# Patient Record
Sex: Male | Born: 1995 | Hispanic: Yes | Marital: Single | Smoking: Current every day smoker
Health system: Southern US, Community
[De-identification: ages and names within clinical notes are randomized; demographics above are authoritative.]

## PROBLEM LIST (undated history)

## (undated) ENCOUNTER — Ambulatory Visit: Admission: EM | Source: Home / Self Care

## (undated) DIAGNOSIS — N410 Acute prostatitis: Secondary | ICD-10-CM

## (undated) HISTORY — PX: FRACTURE SURGERY: SHX138

---

## 2016-08-07 DIAGNOSIS — R361 Hematospermia: Secondary | ICD-10-CM | POA: Insufficient documentation

## 2016-08-07 DIAGNOSIS — N489 Disorder of penis, unspecified: Secondary | ICD-10-CM | POA: Insufficient documentation

## 2017-03-31 ENCOUNTER — Emergency Department
Admission: EM | Admit: 2017-03-31 | Discharge: 2017-03-31 | Disposition: A | Discharge status: Home / Self Care | Payer: Self-pay | Attending: Emergency Medicine | Admitting: Emergency Medicine

## 2017-03-31 ENCOUNTER — Encounter: Payer: Self-pay | Admitting: Intensive Care

## 2017-03-31 ENCOUNTER — Other Ambulatory Visit: Payer: Self-pay

## 2017-03-31 DIAGNOSIS — F17210 Nicotine dependence, cigarettes, uncomplicated: Secondary | ICD-10-CM | POA: Insufficient documentation

## 2017-03-31 DIAGNOSIS — L03221 Cellulitis of neck: Secondary | ICD-10-CM | POA: Insufficient documentation

## 2017-03-31 LAB — COMPREHENSIVE METABOLIC PANEL
ALBUMIN: 4.4 g/dL (ref 3.5–5.0)
ALK PHOS: 112 U/L (ref 38–126)
ALT: 35 U/L (ref 17–63)
AST: 38 U/L (ref 15–41)
Anion gap: 8 (ref 5–15)
BILIRUBIN TOTAL: 0.8 mg/dL (ref 0.3–1.2)
BUN: 12 mg/dL (ref 6–20)
CALCIUM: 9.1 mg/dL (ref 8.9–10.3)
CO2: 27 mmol/L (ref 22–32)
CREATININE: 0.71 mg/dL (ref 0.61–1.24)
Chloride: 101 mmol/L (ref 101–111)
GFR calc Af Amer: >60 mL/min (ref >60)
GFR calc non Af Amer: >60 mL/min (ref >60)
GLUCOSE: 97 mg/dL (ref 65–99)
Potassium: 3.4 mmol/L — ABNORMAL LOW (ref 3.5–5.1)
SODIUM: 136 mmol/L (ref 135–145)
Total Protein: 8.2 g/dL — ABNORMAL HIGH (ref 6.5–8.1)

## 2017-03-31 LAB — CBC
HEMATOCRIT: 41.2 % (ref 40.0–52.0)
HEMOGLOBIN: 13.9 g/dL (ref 13.0–18.0)
MCH: 27.7 pg (ref 26.0–34.0)
MCHC: 33.8 g/dL (ref 32.0–36.0)
MCV: 81.9 fL (ref 80.0–100.0)
Platelets: 225 10*3/uL (ref 150–440)
RBC: 5.03 MIL/uL (ref 4.40–5.90)
RDW: 13.6 % (ref 11.5–14.5)
WBC: 10.5 10*3/uL (ref 3.8–10.6)

## 2017-03-31 MED ORDER — CLINDAMYCIN HCL 300 MG PO CAPS: 300.0000 mg | ORAL_CAPSULE | Freq: Four times a day (QID) | ORAL | 0 refills | Status: DC

## 2017-03-31 MED ORDER — CLINDAMYCIN PHOSPHATE 600 MG/50ML IV SOLN
600.0000 mg | Freq: Once | INTRAVENOUS | Status: AC
  Administered 2017-03-31: 600 mg via INTRAVENOUS
  Filled 2017-03-31: qty 50

## 2017-03-31 NOTE — ED Provider Notes (Signed)
[facility] Emergency Department Provider Note  ____________________________________________  Time seen: Approximately 10:58 AM  I have reviewed the triage vital signs and the nursing notes.   HISTORY  Chief Complaint Abscess    HPI Bryan SoursJesse Cordell Macky LowerMuro Montes is a 21 y.o. male that presents to the emergency department for evaluation of sore area to left side of neck that has been worsening for the last week.  Patient states that he gets several abscesses in his neck and these have been reoccurring since he was you.  Usually they drain on their own or his mom tries to drain them.  He has not checked his temperature but has had chills.  He had a leftover prescription of doxycycline from a previous neck abscess and has been taking it for 3 days without improvement. He has not taken any medications for pain today. No drainage, nausea, vomiting.   History reviewed. No pertinent past medical history.  There are no active problems to display for this patient.   History reviewed. No pertinent surgical history.  Prior to Admission medications   Medication Sig Start Date End Date Taking? Authorizing Provider  clindamycin (CLEOCIN) 300 MG capsule Take 1 capsule (300 mg total) by mouth 4 (four) times daily for 10 days. 03/31/17 04/10/17  Enid DerryWagner, Hayzlee Mcsorley, PA-C    Allergies Patient has no known allergies.  History reviewed. No pertinent family history.  Social History Social History   Tobacco Use  . Smoking status: Current Every Day Smoker    Types: Cigarettes  . Smokeless tobacco: Never Used  Substance Use Topics  . Alcohol use: Yes    Comment: occ  . Drug use: No     Review of Systems  Gastrointestinal: No abdominal pain.  No nausea, no vomiting.  Musculoskeletal: Positive for neck pain Skin: Negative for  abrasions, lacerations, ecchymosis. Positive for rash. Neurological: Negative for headaches, numbness or  tingling   ____________________________________________   PHYSICAL EXAM:  VITAL SIGNS: ED Triage Vitals  Enc Vitals Group     BP 03/31/17 1009 108/73     Pulse Rate 03/31/17 1009 66     Resp 03/31/17 1009 14     Temp 03/31/17 1009 98.2 F (36.8 C)     Temp Source 03/31/17 1009 Oral     SpO2 03/31/17 1009 100 %     Weight 03/31/17 1010 140 lb (63.5 kg)     Height 03/31/17 1010 5\' 11"  (1.803 m)     Head Circumference --      Peak Flow --      Pain Score 03/31/17 1009 8     Pain Loc --      Pain Edu? --      Excl. in GC? --      Constitutional: Alert and oriented. Well appearing and in no acute distress. Eyes: Conjunctivae are normal. PERRL. EOMI. Head: Atraumatic. ENT:      Ears:      Nose: No congestion/rhinnorhea.      Mouth/Throat: Mucous membranes are moist.  Neck: No stridor.  Cardiovascular: Normal rate, regular rhythm.  Good peripheral circulation. Respiratory: Normal respiratory effort without tachypnea or retractions. Lungs CTAB. Good air entry to the bases with no decreased or absent breath sounds. Musculoskeletal: Full range of motion to all extremities. No gross deformities appreciated. Neurologic:  Normal speech and language. No gross focal neurologic deficits are appreciated.  Skin:  Skin is warm, dry and intact. 1.5 cm x 1.5 cm area of erythema with 3  cm streak down neck. Tender to palpation. Some anterior induration but no focal areas of swelling or fluctuance. No drainage.   ____________________________________________   LABS (all labs ordered are listed, but only abnormal results are displayed)  Labs Reviewed  COMPREHENSIVE METABOLIC PANEL - Abnormal; Notable for the following components:      Result Value   Potassium 3.4 (*)    Total Protein 8.2 (*)    All other components within normal limits  CULTURE, BLOOD (ROUTINE X 2)  CULTURE, BLOOD (ROUTINE X 2)  CBC    ____________________________________________  EKG   ____________________________________________  RADIOLOGY  No results found.  ____________________________________________    PROCEDURES  Procedure(s) performed:    Procedures    Medications  clindamycin (CLEOCIN) IVPB 600 mg (0 mg Intravenous Stopped 03/31/17 1235)     ____________________________________________   INITIAL IMPRESSION / ASSESSMENT AND PLAN / ED COURSE  Pertinent labs & imaging results that were available during my care of the patient were reviewed by me and considered in my medical decision making (see chart for details).  Review of the [place] CSRS was performed in accordance of the [facility] prior to dispensing any controlled drugs.   Patient's diagnosis is consistent with cellulitis.  Vital signs and exam are reassuring. Patient is afebrile. There is some induration but I am unable to palpate any focal areas of swelling or fluctuance that are able to drain. No increased WBC on CBC. Blood cultures were drawn. IV Clindamycin was given. Patient is concerned about finances and is agreeable to begin Clindamycin and return to the ER tomorrow morning if symptoms worsen for imaging and further evaluation. He will apply warm compresses to neck. Patient will be discharged home with prescriptions for Clindamycin, and he will continue taking the doxycycline that he has already started. Patient is to follow up with ER as directed. Patient is given ED precautions to return to the ED for any worsening or new symptoms.     ____________________________________________  FINAL CLINICAL IMPRESSION(S) / ED DIAGNOSES  Final diagnoses:  Cellulitis of neck      NEW MEDICATIONS STARTED DURING THIS VISIT:  ED Discharge Orders        Ordered    clindamycin (CLEOCIN) 300 MG capsule  4 times daily     03/31/17 1229          This chart was dictated using voice recognition software/Dragon. Despite best efforts to  proofread, errors can occur which can change the meaning. Any change was purely unintentional.    Enid DerryWagner, Aireonna Bauer, PA-C 03/31/17 1552    Minna AntisPaduchowski, Kevin, MD 04/02/17 [phone]

## 2017-03-31 NOTE — ED Notes (Signed)
Reddened raised area left side of neck 3 cm length, 1.5 cm width. Source unknown

## 2017-03-31 NOTE — ED Triage Notes (Signed)
Patient has abscess present on L side of neck. Swelling and redness noted. Patient reports this has been present for weeks

## 2017-04-02 ENCOUNTER — Inpatient Hospital Stay
Admission: EM | Admit: 2017-04-02 | Discharge: 2017-04-05 | DRG: 603 | Disposition: A | Discharge status: Home / Self Care | Payer: Self-pay | Attending: Internal Medicine | Admitting: Internal Medicine

## 2017-04-02 ENCOUNTER — Emergency Department: Payer: Self-pay

## 2017-04-02 ENCOUNTER — Encounter: Payer: Self-pay | Admitting: Emergency Medicine

## 2017-04-02 ENCOUNTER — Other Ambulatory Visit: Payer: Self-pay

## 2017-04-02 DIAGNOSIS — R300 Dysuria: Secondary | ICD-10-CM | POA: Diagnosis present

## 2017-04-02 DIAGNOSIS — L0211 Cutaneous abscess of neck: Principal | ICD-10-CM | POA: Diagnosis present

## 2017-04-02 DIAGNOSIS — R911 Solitary pulmonary nodule: Secondary | ICD-10-CM | POA: Diagnosis present

## 2017-04-02 DIAGNOSIS — L03221 Cellulitis of neck: Secondary | ICD-10-CM | POA: Diagnosis present

## 2017-04-02 DIAGNOSIS — L0291 Cutaneous abscess, unspecified: Secondary | ICD-10-CM

## 2017-04-02 DIAGNOSIS — L720 Epidermal cyst: Secondary | ICD-10-CM | POA: Diagnosis not present

## 2017-04-02 DIAGNOSIS — F17290 Nicotine dependence, other tobacco product, uncomplicated: Secondary | ICD-10-CM | POA: Diagnosis present

## 2017-04-02 HISTORY — DX: Acute prostatitis: N41.0

## 2017-04-02 LAB — COMPREHENSIVE METABOLIC PANEL
ALK PHOS: 99 U/L (ref 38–126)
ALT: 35 U/L (ref 17–63)
ANION GAP: 8 (ref 5–15)
AST: 33 U/L (ref 15–41)
Albumin: 3.9 g/dL (ref 3.5–5.0)
BILIRUBIN TOTAL: 0.8 mg/dL (ref 0.3–1.2)
BUN: 9 mg/dL (ref 6–20)
CALCIUM: 8.9 mg/dL (ref 8.9–10.3)
CO2: 28 mmol/L (ref 22–32)
Chloride: 101 mmol/L (ref 101–111)
Creatinine, Ser: 0.96 mg/dL (ref 0.61–1.24)
GFR calc Af Amer: >60 mL/min (ref >60)
GFR calc non Af Amer: >60 mL/min (ref >60)
Glucose, Bld: 107 mg/dL — ABNORMAL HIGH (ref 65–99)
POTASSIUM: 4.5 mmol/L (ref 3.5–5.1)
Sodium: 137 mmol/L (ref 135–145)
TOTAL PROTEIN: 7.6 g/dL (ref 6.5–8.1)

## 2017-04-02 LAB — CBC WITH DIFFERENTIAL/PLATELET
Basophils Absolute: 0 10*3/uL (ref 0–0.1)
Basophils Relative: 0 %
EOS ABS: 0.2 10*3/uL (ref 0–0.7)
Eosinophils Relative: 2 %
HEMATOCRIT: 37.9 % — AB (ref 40.0–52.0)
HEMOGLOBIN: 13 g/dL (ref 13.0–18.0)
LYMPHS ABS: 1.6 10*3/uL (ref 1.0–3.6)
Lymphocytes Relative: 16 %
MCH: 28 pg (ref 26.0–34.0)
MCHC: 34.2 g/dL (ref 32.0–36.0)
MCV: 81.9 fL (ref 80.0–100.0)
MONO ABS: 0.7 10*3/uL (ref 0.2–1.0)
Monocytes Relative: 7 %
NEUTROS ABS: 7.6 10*3/uL — AB (ref 1.4–6.5)
NEUTROS PCT: 75 %
Platelets: 261 10*3/uL (ref 150–440)
RBC: 4.63 MIL/uL (ref 4.40–5.90)
RDW: 13.4 % (ref 11.5–14.5)
WBC: 10.1 10*3/uL (ref 3.8–10.6)

## 2017-04-02 MED ORDER — OXYCODONE HCL 5 MG PO TABS
5.0000 mg | ORAL_TABLET | ORAL | Status: DC | PRN
  Administered 2017-04-02 – 2017-04-05 (×10): 5 mg via ORAL
  Filled 2017-04-02 (×10): qty 1

## 2017-04-02 MED ORDER — LIDOCAINE HCL (PF) 1 % IJ SOLN
INTRAMUSCULAR | Status: AC
  Filled 2017-04-02: qty 5

## 2017-04-02 MED ORDER — SODIUM CHLORIDE 0.9 % IV SOLN
3.0000 g | Freq: Four times a day (QID) | INTRAVENOUS | Status: DC
  Administered 2017-04-03 – 2017-04-05 (×10): 3 g via INTRAVENOUS
  Filled 2017-04-02 (×16): qty 3

## 2017-04-02 MED ORDER — VANCOMYCIN HCL IN DEXTROSE 1-5 GM/200ML-% IV SOLN
1000.0000 mg | Freq: Once | INTRAVENOUS | Status: AC
  Administered 2017-04-03: 1000 mg via INTRAVENOUS
  Filled 2017-04-02: qty 200

## 2017-04-02 MED ORDER — ACETAMINOPHEN 650 MG RE SUPP: 650.0000 mg | Freq: Four times a day (QID) | RECTAL | Status: DC | PRN

## 2017-04-02 MED ORDER — IOPAMIDOL (ISOVUE-300) INJECTION 61%
75.0000 mL | Freq: Once | INTRAVENOUS | Status: AC | PRN
  Administered 2017-04-02: 75 mL via INTRAVENOUS
  Filled 2017-04-02: qty 75

## 2017-04-02 MED ORDER — ACETAMINOPHEN 325 MG PO TABS
650.0000 mg | ORAL_TABLET | Freq: Four times a day (QID) | ORAL | Status: DC | PRN
  Administered 2017-04-03 – 2017-04-05 (×3): 650 mg via ORAL
  Filled 2017-04-02 (×4): qty 2

## 2017-04-02 MED ORDER — ENOXAPARIN SODIUM 40 MG/0.4ML ~~LOC~~ SOLN
40.0000 mg | SUBCUTANEOUS | Status: DC
  Administered 2017-04-03: 40 mg via SUBCUTANEOUS
  Filled 2017-04-02: qty 0.4

## 2017-04-02 NOTE — H&P (Signed)
[facility] [place] at [facility]   PATIENT NAME: Bryan Montes    MR#:  [id number]  DATE OF BIRTH:  26-Mar-1996  DATE OF ADMISSION:  04/02/2017  PRIMARY CARE PHYSICIAN: [facility]   REQUESTING/REFERRING PHYSICIAN: Fanny Bien, MD  CHIEF COMPLAINT:   Chief Complaint  Patient presents with  . Abscess    HISTORY OF PRESENT ILLNESS:  Bryan Montes  is a 22 y.o. male who presents with longtime history of recurring abscesses.  Patient states that for the past 7 years he has been getting abscesses on his face, neck, as well as back and buttocks.  He states that recently they have not been as bad on his back and buttocks but he keeps getting recurrent episodes of what sound like folliculitis on his neck.  He came in tonight and was seen by ENT who drained the abscesses.  There is no evidence of deep tissue involvement in these abscesses on CT imaging, however ENT did leave a drain in on the left side of his neck.  Hospitalist were called for admission.  Of note, the patient had started on some IV antibiotics for these abscesses and corresponding cellulitis, but these oral antibiotics were not effective.  PAST MEDICAL HISTORY:   Past Medical History:  Diagnosis Date  . Acute prostatitis     PAST SURGICAL HISTORY:   Past Surgical History:  Procedure Laterality Date  . FRACTURE SURGERY      SOCIAL HISTORY:   Social History   Tobacco Use  . Smoking status: Current Every Day Smoker    Types: Cigars  . Smokeless tobacco: Never Used  Substance Use Topics  . Alcohol use: Yes    Comment: occ    FAMILY HISTORY:   Family History  Problem Relation Age of Onset  . Breast cancer Maternal Grandmother     DRUG ALLERGIES:  No Known Allergies  MEDICATIONS AT HOME:   Prior to Admission medications   Medication Sig Start Date End Date Taking? Authorizing Provider  clindamycin (CLEOCIN) 300 MG capsule Take 1 capsule (300 mg total)  by mouth 4 (four) times daily for 10 days. 03/31/17 04/10/17 Yes Enid Derry, PA-C  doxycycline (VIBRAMYCIN) 100 MG capsule Take 1 capsule by mouth 2 (two) times daily. 09/27/16  Yes [provider]  ibuprofen (ADVIL,MOTRIN) 200 MG tablet Take 200-400 mg by mouth every 6 (six) hours as needed.   Yes [provider]  Multiple Vitamin (MULTIVITAMIN WITH MINERALS) TABS tablet Take 1 tablet by mouth daily.   Yes [provider]    REVIEW OF SYSTEMS:  Review of Systems  Constitutional: Negative for chills, fever, malaise/fatigue and weight loss.  HENT: Negative for ear pain, hearing loss and tinnitus.   Eyes: Negative for blurred vision, double vision, pain and redness.  Respiratory: Negative for cough, hemoptysis and shortness of breath.   Cardiovascular: Negative for chest pain, palpitations, orthopnea and leg swelling.  Gastrointestinal: Negative for abdominal pain, constipation, diarrhea, nausea and vomiting.  Genitourinary: Negative for dysuria, frequency and hematuria.  Musculoskeletal: Negative for back pain, joint pain and neck pain.  Skin:       Neck abscesses  Neurological: Negative for dizziness, tremors, focal weakness and weakness.  Endo/Heme/Allergies: Negative for polydipsia. Does not bruise/bleed easily.  Psychiatric/Behavioral: Negative for depression. The patient is not nervous/anxious and does not have insomnia.      VITAL SIGNS:   Vitals:   04/02/17 1651  BP: (!) 112/46  Pulse:  89  Resp: 18  Temp: 98.3 F (36.8 C)  TempSrc: Oral  SpO2: 99%  Weight: 63.5 kg (140 lb)  Height: (!) 11" (0.279 m)   Wt Readings from Last 3 Encounters:  04/02/17 63.5 kg (140 lb)  03/31/17 63.5 kg (140 lb)    PHYSICAL EXAMINATION:  Physical Exam  Vitals reviewed. Constitutional: He is oriented to person, place, and time. He appears well-developed and well-nourished. No distress.  HENT:  Head: Normocephalic and atraumatic.  Mouth/Throat: Oropharynx is  clear and moist.  Eyes: Conjunctivae and EOM are normal. Pupils are equal, round, and reactive to light. No scleral icterus.  Neck: Normal range of motion. Neck supple. No thyromegaly present.  Bilateral abscesses with surrounding erythema which have been drained, the left abscess has drained tube in place  Cardiovascular: Normal rate, regular rhythm and intact distal pulses. Exam reveals no gallop and no friction rub.  No murmur heard. Respiratory: Effort normal and breath sounds normal. No respiratory distress. He has no wheezes. He has no rales.  GI: Soft. Bowel sounds are normal. He exhibits no distension. There is no tenderness.  Musculoskeletal: Normal range of motion. He exhibits no edema.  No arthritis, no gout  Neurological: He is alert and oriented to person, place, and time. No cranial nerve deficit.  No dysarthria, no aphasia  Skin: Skin is warm and dry. No rash noted. There is erythema (bl neck).  Psychiatric: He has a normal mood and affect. His behavior is normal. Judgment and thought content normal.    LABORATORY PANEL:   CBC Recent Labs  Lab 04/02/17 1727  WBC 10.1  HGB 13.0  HCT 37.9*  PLT 261   ------------------------------------------------------------------------------------------------------------------  Chemistries  Recent Labs  Lab 04/02/17 1727  NA 137  K 4.5  CL 101  CO2 28  GLUCOSE 107*  BUN 9  CREATININE 0.96  CALCIUM 8.9  AST 33  ALT 35  ALKPHOS 99  BILITOT 0.8   ------------------------------------------------------------------------------------------------------------------  Cardiac Enzymes No results for input(s): TROPONINI in the last 168 hours. ------------------------------------------------------------------------------------------------------------------  RADIOLOGY:  Ct Soft Tissue Neck W Contrast  Result Date: 04/02/2017 CLINICAL DATA:  Posterior left neck abscess. EXAM: CT NECK WITH CONTRAST TECHNIQUE: Multidetector CT  imaging of the neck was performed using the standard protocol following the bolus administration of intravenous contrast. CONTRAST:  75mL ISOVUE-300 IOPAMIDOL (ISOVUE-300) INJECTION 61% COMPARISON:  None. FINDINGS: Pharynx and larynx: No focal mucosal or submucosal lesions are present. Tonsils are within normal limits. The oropharynx, nasopharynx, and hypopharynx are unremarkable. The epiglottis is normal. Vocal cords are midline and symmetric. Salivary glands: The submandibular and parotid glands are within normal limits bilaterally. Thyroid: Normal Lymph nodes: Bilateral reactive type cervical lymph nodes are present in the level 2, level 3, and level 4 stations, left greater than right. Vascular: Negative. Limited intracranial: Unremarkable. Visualized orbits: Within normal limits. Mastoids and visualized paranasal sinuses: The paranasal sinuses and mastoid air cells are clear. Skeleton: Vertebral body heights and alignment are normal. No focal lytic or blastic lesions are present. No definite disc disease is present. Upper chest: The lung apices are clear.  An azygos fissure is noted. Other: A complex peripherally enhancing fluid collection is present within the subcutaneous tissues of the left posterolateral neck. The area measures 6.5 x 4.4 x 2.2 cm. The collection extends to the deep cervical fascia and is inseparable from the left trapezius muscle. There is no definite extension beyond the deep cervical fascia. There is skin thickening and a smaller  collection in the right posterolateral neck measuring 1.3 x 1.1 x 0.7 cm. IMPRESSION: 1. Complex subcutaneous peripherally enhancing fluid collection compatible with abscess in the left posterolateral neck measuring 6.5 x 4.4 x 2.2 cm extending to the deep cervical fascia without definite invasion into the deeper spaces. 2. The collection is inseparable from the superior aspect of the left trapezius muscle. 3. A smaller subcutaneous collection is also present in  the right posterolateral neck measuring 1.1 cm, also concerning for infection. 4. Bilateral cervical adenopathy is likely reactive. 5. No focal inflammatory change or abscess within the anterior neck. No discrete etiology. This appears to be related to a superficial cellulitis. Electronically Signed   By: Marin Roberts M.D.   On: 04/02/2017 18:15    EKG:  No orders found for this or any previous visit.  IMPRESSION AND PLAN:  Principal Problem:   Neck abscess -broad-spectrum IV antibiotics, wound cultures were sent by ENT, they are following along and will defer to their recommendations for wound management.  They also recommended considering ID consult which has been placed.  All the records are reviewed and case discussed with ED provider. Management plans discussed with the patient and/or family.  DVT PROPHYLAXIS: SubQ lovenox  GI PROPHYLAXIS: None  ADMISSION STATUS: Inpatient  CODE STATUS: Full Code Status History    This patient does not have a recorded code status. Please follow your organizational policy for patients in this situation.     TOTAL TIME TAKING CARE OF THIS PATIENT: 45 minutes.   Shir Bergman FIELDING 04/02/2017, 9:49 PM  [employer]  [phone]  CC: Primary care physician; [facility]  Note:  This document was prepared using Dragon voice recognition software and may include unintentional dictation errors.

## 2017-04-02 NOTE — ED Provider Notes (Signed)
[facility] Emergency Department Provider Note  ____________________________________________   First MD Initiated Contact with Patient 04/02/17 1705     (approximate)  I have reviewed the triage vital signs and the nursing notes.   HISTORY  Chief Complaint Abscess    HPI Caley Volkert Coolidge Gossard is a 22 y.o. male states he was seen on 03/31/17 for an abscessed tooth side of his neck, states he was given IV antibiotics, was discharged with clindamycin and doxycycline, he has been taken all of these medications and the area has become more red and swollen, he denies fever at this time, he denies any trouble breathing, he denies chest pain or shortness of breath  History reviewed. No pertinent past medical history.  There are no active problems to display for this patient.   History reviewed. No pertinent surgical history.  Prior to Admission medications   Medication Sig Start Date End Date Taking? Authorizing Provider  clindamycin (CLEOCIN) 300 MG capsule Take 1 capsule (300 mg total) by mouth 4 (four) times daily for 10 days. 03/31/17 04/10/17  Enid Derry, PA-C    Allergies Patient has no known allergies.  No family history on file.  Social History Social History   Tobacco Use  . Smoking status: Current Every Day Smoker    Types: Cigars  . Smokeless tobacco: Never Used  Substance Use Topics  . Alcohol use: Yes    Comment: occ  . Drug use: No    Review of Systems  Constitutional: No fever/chills Eyes: No visual changes. ENT: No sore throat. Respiratory: Denies cough Genitourinary: Negative for dysuria. Musculoskeletal: Negative for back pain. Skin: Positive for increased redness and swelling at the area on his neck that was marked    ____________________________________________   PHYSICAL EXAM:  VITAL SIGNS: ED Triage Vitals  Enc Vitals Group     BP 04/02/17 1651 (!) 112/46     Pulse Rate 04/02/17 1651 89     Resp  04/02/17 1651 18     Temp 04/02/17 1651 98.3 F (36.8 C)     Temp Source 04/02/17 1651 Oral     SpO2 04/02/17 1651 99 %     Weight 04/02/17 1651 140 lb (63.5 kg)     Height 04/02/17 1651 (!) 11" (0.279 m)     Head Circumference --      Peak Flow --      Pain Score 04/02/17 1650 8     Pain Loc --      Pain Edu? --      Excl. in GC? --     Constitutional: Alert and oriented. Well appearing and in no acute distress. Eyes: Conjunctivae are normal.  Head: Atraumatic. Nose: No congestion/rhinnorhea. Mouth/Throat: Mucous membranes are moist.  Throat is normal Cardiovascular: Normal rate, regular rhythm.  Heart sounds are normal Respiratory: Normal respiratory effort.  No retractions, lungs are clear to auscultation GU: deferred Musculoskeletal: FROM all extremities, warm and well perfused Neurologic:  Normal speech and language.  Skin:  Skin is warm, dry and intact.  The cellulitis at the left side of his neck has moved outside of the area that was marked with black ink, the area is more swollen and tender, neurovascular is intact. Psychiatric: Mood and affect are normal. Speech and behavior are normal.  ____________________________________________   LABS (all labs ordered are listed, but only abnormal results are displayed)  Labs Reviewed  CBC WITH DIFFERENTIAL/PLATELET - Abnormal; Notable for the following components:  Result Value   HCT 37.9 (*)    Neutro Abs 7.6 (*)    All other components within normal limits  COMPREHENSIVE METABOLIC PANEL - Abnormal; Notable for the following components:   Glucose, Bld 107 (*)    All other components within normal limits   ____________________________________________   ____________________________________________  RADIOLOGY  CT of the soft tissues of the neck shows a large collection of fluid typical of an abscess that is deep into the tissues of the  neck.  ____________________________________________   PROCEDURES  Procedure(s) performed: No    ____________________________________________   INITIAL IMPRESSION / ASSESSMENT AND PLAN / ED COURSE  Pertinent labs & imaging results that were available during my care of the patient were reviewed by me and considered in my medical decision making (see chart for details).  Patient is a 22 year old male that was previously seen on 03/31/2017 for an abscess to the left side of the neck, he was given IV clindamycin and was discharged on oral clindamycin and doxycycline, he states the area has continued to enlarge and feels that it is worse, CT of the neck shows a large abscess spreading into the deep tissues, thus the CT and the failed outpatient therapy with Dr. Lamont Snowballifenbark , he recommended that I call ENT, page Dr. Gershon Crane Connell, he states he will come and see the patient and possibly I&D at bedside with admission after that    ----------------------------------------- 10:10 PM on 04/02/2017 -----------------------------------------  Dr. Gershon Crane'Connell was in to I&D the area cultures were sent to the lab, discussed admission with the hospitalist, hospitalist agrees to admit the patient  As part of my medical decision making, I reviewed the following data within the electronic MEDICAL RECORD NUMBERPrevious encounter to the ED ____________________________________________   FINAL CLINICAL IMPRESSION(S) / ED DIAGNOSES  Final diagnoses:  Abscess      NEW MEDICATIONS STARTED DURING THIS VISIT:  This SmartLink is deprecated. Use AVSMEDLIST instead to display the medication list for a patient.   Note:  This document was prepared using Dragon voice recognition software and may include unintentional dictation errors.    Faythe GheeFisher, Susan W, PA-C 04/02/17 2219    [name], Neil, MD 04/03/17 [phone]

## 2017-04-02 NOTE — ED Notes (Signed)
See triage note  Presents for recheck of abscess area to neck  Area is larger and more red

## 2017-04-02 NOTE — Consult Note (Signed)
HPI: I am seeing Bryan Montes in consultation for neck abscesses. 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 This occur bilaterally. They have been drained before. 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 He was recommended ID f/u but was not able to complete due to lack of insurance. This most recent episode has been present for 2 weeks. He has had progression of symptoms despite multiple days of oral antibiotics. There are 3 distinct areas, left posterolateral neck, left SMG region, right posterolateral neck.  On review of care everywhere, prior HIV testing negative.  PMHx: negative except as above Social History: + tobacco, denies elicit drugs, never used IV drugs Family History: there is family history of some superficial infections ROS: 10 point ROS performed, negative except as per HPI  Exam: Gen: NAD,NWOB, voice strong Neck: right neck fluctuance ~1 cm posterolateral Large left neck fluctuance/tenderness/erythema, ~3 cm region Firm left SMG region, no fluctuance Nose: clear anterior rhinoscopy OC/OP: normal mucous membranes, dentition normal, tonsils normal, no lesions/masses Resp: no stridor, voice strong Eyes: EOMI Ear: pinna normal shape, no cutaneous lesions  Imaging: Imaging was personally reviewed. Phlegmon left SMG region. Large multiloculated superficial fluid collection c/w abscess left posterolateral neck. 1 cm fluid collection c/w abscess right posterolateral neck.  Labs: VSS, no leukocytosis  Procedure: informed consent was obtained for drainage of bilateral neck abscesses. Both areas were prepped and draped. They were infiltrated with xylocaine. A 15 blade was used to make a 2 cm incision on the left. Copious pus was expressed. Hemostats were used to break up loculations. This was extremely painful for the patient. A penrose drain was placed. On the right, a 15 blade  was used to make a stab incision, purulence was expressed.  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 I drained both abscesses without complication; as of now there is no fluid to address in the left SMG region. A drain was left in on the left, would continue for 48 hours then remove as long as clinical progress is observed.  We discussed that both location and recurrent nature of his abscesses are atypical. Recommend admission for broad spectrum IV abx and further immunocompromised workup. Consider ID consult. We sent fluid for aerobic/anaerobic culture, mycobacterial culture, and cytology.

## 2017-04-02 NOTE — ED Triage Notes (Signed)
Abscess L posterior neck x 2 weeks.

## 2017-04-02 NOTE — ED Notes (Signed)
Patient transported to room 138. 

## 2017-04-03 LAB — URINALYSIS, ROUTINE W REFLEX MICROSCOPIC
BILIRUBIN URINE: NEGATIVE
Glucose, UA: NEGATIVE mg/dL
HGB URINE DIPSTICK: NEGATIVE
Ketones, ur: NEGATIVE mg/dL
Leukocytes, UA: NEGATIVE
Nitrite: NEGATIVE
Protein, ur: NEGATIVE mg/dL
SPECIFIC GRAVITY, URINE: 1.005 (ref 1.005–1.030)
pH: 6 (ref 5.0–8.0)

## 2017-04-03 LAB — BASIC METABOLIC PANEL
Anion gap: 9 (ref 5–15)
BUN: 8 mg/dL (ref 6–20)
CALCIUM: 8.7 mg/dL — AB (ref 8.9–10.3)
CO2: 27 mmol/L (ref 22–32)
CREATININE: 0.81 mg/dL (ref 0.61–1.24)
Chloride: 100 mmol/L — ABNORMAL LOW (ref 101–111)
GFR calc Af Amer: >60 mL/min (ref >60)
GFR calc non Af Amer: >60 mL/min (ref >60)
GLUCOSE: 99 mg/dL (ref 65–99)
Potassium: 3.5 mmol/L (ref 3.5–5.1)
Sodium: 136 mmol/L (ref 135–145)

## 2017-04-03 LAB — CBC
HCT: 44.9 % (ref 40.0–52.0)
HEMOGLOBIN: 14.8 g/dL (ref 13.0–18.0)
MCH: 26.9 pg (ref 26.0–34.0)
MCHC: 32.9 g/dL (ref 32.0–36.0)
MCV: 81.7 fL (ref 80.0–100.0)
PLATELETS: 294 10*3/uL (ref 150–440)
RBC: 5.49 MIL/uL (ref 4.40–5.90)
RDW: 13.5 % (ref 11.5–14.5)
WBC: 12.7 10*3/uL — ABNORMAL HIGH (ref 3.8–10.6)

## 2017-04-03 MED ORDER — VANCOMYCIN HCL IN DEXTROSE 1-5 GM/200ML-% IV SOLN
1000.0000 mg | Freq: Three times a day (TID) | INTRAVENOUS | Status: DC
  Administered 2017-04-03 – 2017-04-05 (×7): 1000 mg via INTRAVENOUS
  Filled 2017-04-03 (×11): qty 200

## 2017-04-03 MED ORDER — DIPHENHYDRAMINE HCL 25 MG PO CAPS
25.0000 mg | ORAL_CAPSULE | Freq: Four times a day (QID) | ORAL | Status: DC | PRN
  Administered 2017-04-03: 25 mg via ORAL
  Filled 2017-04-03: qty 1

## 2017-04-03 NOTE — Plan of Care (Signed)
  Education: Knowledge of General Education information will improve 04/03/2017 0519 - Progressing by Gregroy Dombkowski, Genelle GatherMatthew Scott, RN

## 2017-04-03 NOTE — Clinical Social Work Note (Signed)
Clinical Social Work Assessment  Patient Details  Name: Bryan Montes MRN: [id number] Date of Birth: February 08, 1996  Date of referral:  04/03/17               Reason for consult:  [employer], Financial Concerns                Permission sought to share information with:  Case Freight forwarder, Tourist information centre manager ) Permission granted to share information::  Yes, Verbal Permission Granted  Name::        Agency::     Relationship::     Contact Information:     Housing/Transportation Living arrangements for the past 2 months:  No permanent address Source of Information:  Patient Patient Interpreter Needed:  None Criminal Activity/Legal Involvement Pertinent to Current Situation/Hospitalization:  No - Comment as needed Significant Relationships:  Parents, Friend Lives with:  Parents Do you feel safe going back to the place where you live?  Yes Need for family participation in patient care:  Yes (Comment)  Care giving concerns:  Patient reported that he does not have a permanent address and stays on his parent's couch (in [place]) and will go stay with friends.    Social Worker assessment / plan:  Holiday representative (CSW) received consult from MD to provide information about medicaid. CSW left a voicemail for the [employer] asking her to assess patient because he has no insurance. CSW met with patient at bedside to provide resources. Patient was sleeping upon CSW entering the room however he was easily woken up. A person was also sleeping in the chair at bedside. Patient reported that he felt comfortable talking in front of that person. CSW introduced self and explained role of CSW department. Patient reported that he has not worked since August 2018 and does not go to school. Patient reported that he does not have a permanent address and stays oh his parent's couch and stays with friends. CSW provided patient with a list of [place]  resources that included transportation, housing and [employer]. CSW also provided patient with a [place] Medicaid application and explained that he would have to complete it and bring it to the DSS office. Patient verbalized his understanding and reported no other needs or concerns. Please reconsult if future social work needs arise. CSW signing off.   Employment status:  Unemployed Forensic scientist:  Self Pay (Medicaid Pending) PT Recommendations:  Not assessed at this time Information / Referral to community resources:  Other (Comment Required)(Medicaid application. )  Patient/Family's Response to care:  Patient appeared motivated to follow up with DSS and complete medicaid application.   Patient/Family's Understanding of and Emotional Response to Diagnosis, Current Treatment, and Prognosis:  Patient reported no other needs and was very pleasant. Patient thanked CSW for visit.   Emotional Assessment Appearance:  Appears stated age Attitude/Demeanor/Rapport:    Affect (typically observed):  Accepting, Adaptable, Pleasant Orientation:  Oriented to Self, Oriented to Place, Oriented to  Time, Oriented to Situation Alcohol / Substance use:  Not Applicable Psych involvement (Current and /or in the community):  No (Comment)  Discharge Needs  Concerns to be addressed:  No discharge needs identified Readmission within the last 30 days:  No Current discharge risk:  None Barriers to Discharge:  Continued Medical Work up   [employer], Veronia Beets, LCSW 04/03/2017, 11:34 AM

## 2017-04-03 NOTE — Consult Note (Signed)
[facility] Infectious Disease     Reason for Consult: Neck abscess, recurrent infections    Referring Physician: Gouru, A Date of Admission:  04/02/2017  Principal Problem:   Neck abscess   HPI: Bryan Montes is a 22 y.o. male admitted with progressive neck pain and swelling and found to have bil neck abscesses.  He has an interesting history of recurrent skin abscesses since the age of 57 but has not been evaluated for these. He states they will often come to a head and drain or will respond to bactrim. He does not recall any prior culture results.  He has had innumerable lesions on his upper back, face, neck, buttock area and LE.  Bryan Montes has some in groin and axilla.  He has not had to have IV abx for these in the ast and denies much work up for them.  He has no other hx of recurrent infections besides repeat strep infections as a child.  He is from [place] but moved to [place] around age 24.  Has been abroad only once to [place] at age 3.  No pets or animal exposures. Has worked in truck washes in past.  Denies any other exposures. Denies fam hx immunodeficiency.  Has had STD tests and neg HIV in Aug 2108.  Past Medical History:  Diagnosis Date  . Acute prostatitis    Past Surgical History:  Procedure Laterality Date  . FRACTURE SURGERY     Social History   Tobacco Use  . Smoking status: Current Every Day Smoker    Types: Cigars  . Smokeless tobacco: Never Used  Substance Use Topics  . Alcohol use: Yes    Comment: occ  . Drug use: No   Family History  Problem Relation Age of Onset  . Breast cancer Maternal Grandmother     Allergies: No Known Allergies  Current antibiotics: Antibiotics Given (last 72 hours)    Date/Time Action Medication Dose Rate   04/03/17 0037 New Bag/Given   vancomycin (VANCOCIN) IVPB 1000 mg/200 mL premix 1,000 mg 200 mL/hr   04/03/17 0037 New Bag/Given   Ampicillin-Sulbactam (UNASYN) 3 g in sodium chloride 0.9 % 100 mL IVPB 3 g 200 mL/hr    04/03/17 0525 New Bag/Given   Ampicillin-Sulbactam (UNASYN) 3 g in sodium chloride 0.9 % 100 mL IVPB 3 g 200 mL/hr   04/03/17 0814 New Bag/Given   vancomycin (VANCOCIN) IVPB 1000 mg/200 mL premix 1,000 mg 200 mL/hr   04/03/17 1206 New Bag/Given  [Med not available from pharmacy at scheculed time resent  x2 per Karen]   Ampicillin-Sulbactam (UNASYN) 3 g in sodium chloride 0.9 % 100 mL IVPB 3 g 200 mL/hr   04/03/17 1510 New Bag/Given   vancomycin (VANCOCIN) IVPB 1000 mg/200 mL premix 1,000 mg 200 mL/hr      MEDICATIONS: . enoxaparin (LOVENOX) injection  40 mg Subcutaneous Q24H    Review of Systems - 11 systems reviewed and negative per HPI   OBJECTIVE: Temp:  [98.3 F (36.8 C)-98.4 F (36.9 C)] 98.3 F (36.8 C) (01/02 0900) Pulse Rate:  [68-89] 72 (01/02 0900) Resp:  [16-18] 18 (01/02 0900) BP: (112-119)/(46-59) 118/57 (01/02 0900) SpO2:  [98 %-99 %] 98 % (01/02 0900) Weight:  [63.5 kg (140 lb)-63.7 kg (140 lb 6.4 oz)] 63.7 kg (140 lb 6.4 oz) (01/01 2347) Physical Exam  Constitutional: He is oriented to person, place, and time. He appears thin No distress.  HENT: anicteric, L neck with Penrose drain in place  from recent I and D with ss drainage Mouth/Throat: Oropharynx is clear and moist. No oropharyngeal exudate.  Cardiovascular: Normal rate, regular rhythm and normal heart sounds. Exam reveals no gallop and no friction rub.  Pulmonary/Chest: Effort normal and breath sounds normal. No respiratory distress. He has no wheezes.  Abdominal: Soft. Bowel sounds are normal. He exhibits no distension. There is no tenderness.  Lymphadenopathy: He has noshoddy  cervical adenopathy.  Neurological: He is alert and oriented to person, place, and time.  Skin: mult healed scars on back, and buttock, scars on face as well. Psychiatric: He has a normal mood and affect. His behavior is normal.    LABS: Results for orders placed or performed during the hospital encounter of 04/02/17 (from  the past 48 hour(s))  CBC with Differential     Status: Abnormal   Collection Time: 04/02/17  5:27 PM  Result Value Ref Range   WBC 10.1 3.8 - 10.6 K/uL   RBC 4.63 4.40 - 5.90 MIL/uL   Hemoglobin 13.0 13.0 - 18.0 g/dL   HCT 37.9 (L) 40.0 - 52.0 %   MCV 81.9 80.0 - 100.0 fL   MCH 28.0 26.0 - 34.0 pg   MCHC 34.2 32.0 - 36.0 g/dL   RDW 13.4 11.5 - 14.5 %   Platelets 261 150 - 440 K/uL   Neutrophils Relative % 75 %   Neutro Abs 7.6 (H) 1.4 - 6.5 K/uL   Lymphocytes Relative 16 %   Lymphs Abs 1.6 1.0 - 3.6 K/uL   Monocytes Relative 7 %   Monocytes Absolute 0.7 0.2 - 1.0 K/uL   Eosinophils Relative 2 %   Eosinophils Absolute 0.2 0 - 0.7 K/uL   Basophils Relative 0 %   Basophils Absolute 0.0 0 - 0.1 K/uL    Comment: Performed at [facility], [address] [place], [place] [address]  Comprehensive metabolic panel     Status: Abnormal   Collection Time: 04/02/17  5:27 PM  Result Value Ref Range   Sodium 137 135 - 145 mmol/L   Potassium 4.5 3.5 - 5.1 mmol/L   Chloride 101 101 - 111 mmol/L   CO2 28 22 - 32 mmol/L   Glucose, Bld 107 (H) 65 - 99 mg/dL   BUN 9 6 - 20 mg/dL   Creatinine, Ser 0.96 0.61 - 1.24 mg/dL   Calcium 8.9 8.9 - 10.3 mg/dL   Total Protein 7.6 6.5 - 8.1 g/dL   Albumin 3.9 3.5 - 5.0 g/dL   AST 33 15 - 41 U/L   ALT 35 17 - 63 U/L   Alkaline Phosphatase 99 38 - 126 U/L   Total Bilirubin 0.8 0.3 - 1.2 mg/dL   GFR calc non Af Amer >60 >60 mL/min   GFR calc Af Amer >60 >60 mL/min    Comment: (NOTE) The eGFR has been calculated using the CKD EPI equation. This calculation has not been validated in all clinical situations. eGFR's persistently <60 mL/min signify possible Chronic Kidney Disease.    Anion gap 8 5 - 15    Comment: Performed at [facility], [address] [place], [place] [address]  Aerobic/Anaerobic Culture (surgical/deep wound)     Status: None (Preliminary result)   Collection Time: 04/02/17  8:56 PM  Result Value Ref Range    Specimen Description      NECK Performed at [facility], [address], [place], [place] [address]    Special Requests  NONE Performed at [facility], [place], [place] [address]    Gram Stain      FEW WBC PRESENT, PREDOMINANTLY PMN RARE GRAM VARIABLE ROD Performed at [facility], [place] [address], [place], [place] [address]    Culture PENDING    Report Status PENDING   Basic metabolic panel     Status: Abnormal   Collection Time: 04/03/17  4:26 AM  Result Value Ref Range   Sodium 136 135 - 145 mmol/L   Potassium 3.5 3.5 - 5.1 mmol/L   Chloride 100 (L) 101 - 111 mmol/L   CO2 27 22 - 32 mmol/L   Glucose, Bld 99 65 - 99 mg/dL   BUN 8 6 - 20 mg/dL   Creatinine, Ser 0.81 0.61 - 1.24 mg/dL   Calcium 8.7 (L) 8.9 - 10.3 mg/dL   GFR calc non Af Amer >60 >60 mL/min   GFR calc Af Amer >60 >60 mL/min    Comment: (NOTE) The eGFR has been calculated using the CKD EPI equation. This calculation has not been validated in all clinical situations. eGFR's persistently <60 mL/min signify possible Chronic Kidney Disease.    Anion gap 9 5 - 15    Comment: Performed at [facility], [address] [place], [place] [address]  CBC     Status: Abnormal   Collection Time: 04/03/17  4:26 AM  Result Value Ref Range   WBC 12.7 (H) 3.8 - 10.6 K/uL   RBC 5.49 4.40 - 5.90 MIL/uL   Hemoglobin 14.8 13.0 - 18.0 g/dL   HCT 44.9 40.0 - 52.0 %   MCV 81.7 80.0 - 100.0 fL   MCH 26.9 26.0 - 34.0 pg   MCHC 32.9 32.0 - 36.0 g/dL   RDW 13.5 11.5 - 14.5 %   Platelets 294 150 - 440 K/uL    Comment: Performed at [facility], [address] [place], [place] [address]  Urinalysis, Routine w reflex microscopic     Status: Abnormal   Collection Time: 04/03/17 12:30 PM  Result Value Ref Range   Color, Urine STRAW (A) YELLOW   APPearance CLEAR (A) CLEAR   Specific Gravity, Urine 1.005 1.005 - 1.030   pH 6.0 5.0 - 8.0   Glucose, UA NEGATIVE  NEGATIVE mg/dL   Hgb urine dipstick NEGATIVE NEGATIVE   Bilirubin Urine NEGATIVE NEGATIVE   Ketones, ur NEGATIVE NEGATIVE mg/dL   Protein, ur NEGATIVE NEGATIVE mg/dL   Nitrite NEGATIVE NEGATIVE   Leukocytes, UA NEGATIVE NEGATIVE    Comment: Performed at [facility], [address] [place], [place] [address]   No components found for: ESR, C REACTIVE PROTEIN MICRO: Recent Results (from the past 720 hour(s))  Blood culture (routine x 2)     Status: None (Preliminary result)   Collection Time: 03/31/17 11:08 AM  Result Value Ref Range Status   Specimen Description BLOOD LEFT ANTECUBITAL  Final   Special Requests   Final    BOTTLES DRAWN AEROBIC AND ANAEROBIC Blood Culture results may not be optimal due to an excessive volume of blood received in culture bottles   Culture   Final    NO GROWTH 3 DAYS Performed at [facility], [address] [place], [place] [address]    Report Status PENDING  Incomplete  Blood culture (routine x 2)     Status: None (Preliminary result)   Collection Time: 03/31/17 11:33 AM  Result Value Ref Range Status  Specimen Description BLOOD RIGHT ANTECUBITAL  Final   Special Requests   Final    BOTTLES DRAWN AEROBIC AND ANAEROBIC Blood Culture adequate volume   Culture   Final    NO GROWTH 3 DAYS Performed at [facility], [address] [place], [place] [address]    Report Status PENDING  Incomplete  Aerobic/Anaerobic Culture (surgical/deep wound)     Status: None (Preliminary result)   Collection Time: 04/02/17  8:56 PM  Result Value Ref Range Status   Specimen Description   Final    NECK Performed at [facility], [address], [place], [place] [address]    Special Requests   Final    NONE Performed at [facility], [address] [place], [place] [address]    Gram Stain   Final    FEW WBC PRESENT, PREDOMINANTLY PMN RARE GRAM VARIABLE ROD Performed at [facility], [place] [address], [place], [place] [address]    Culture PENDING  Incomplete   Report Status PENDING  Incomplete    IMAGING: Ct Soft Tissue Neck W Contrast  Result Date: 04/02/2017 CLINICAL DATA:  Posterior left neck abscess. EXAM: CT NECK WITH CONTRAST TECHNIQUE: Multidetector CT imaging of the neck was performed using the standard protocol following the bolus administration of intravenous contrast. CONTRAST:  32m ISOVUE-300 IOPAMIDOL (ISOVUE-300) INJECTION 61% COMPARISON:  None. FINDINGS: Pharynx and larynx: No focal mucosal or submucosal lesions are present. Tonsils are within normal limits. The oropharynx, nasopharynx, and hypopharynx are unremarkable. The epiglottis is normal. Vocal cords are midline and symmetric. Salivary glands: The submandibular and parotid glands are within normal limits bilaterally. Thyroid: Normal Lymph nodes: Bilateral reactive type cervical lymph nodes are present in the level 2, level 3, and level 4 stations, left greater than right. Vascular: Negative. Limited intracranial: Unremarkable. Visualized orbits: Within normal limits. Mastoids and visualized paranasal sinuses: The paranasal sinuses and mastoid air cells are clear. Skeleton: Vertebral body heights and alignment are normal. No focal lytic or blastic lesions are present. No definite disc disease is present. Upper chest: The lung apices are clear.  An azygos fissure is noted. Other: A complex peripherally enhancing fluid collection is present within the subcutaneous tissues of the left posterolateral neck. The area measures 6.5 x 4.4 x 2.2 cm. The collection extends to the deep cervical fascia and is inseparable from the left trapezius muscle. There is no definite extension beyond the deep cervical fascia. There is skin thickening and a smaller collection in the right posterolateral neck measuring 1.3 x 1.1 x 0.7 cm. IMPRESSION: 1. Complex subcutaneous peripherally enhancing fluid collection compatible with abscess in the left  posterolateral neck measuring 6.5 x 4.4 x 2.2 cm extending to the deep cervical fascia without definite invasion into the deeper spaces. 2. The collection is inseparable from the superior aspect of the left trapezius muscle. 3. A smaller subcutaneous collection is also present in the right posterolateral neck measuring 1.1 cm, also concerning for infection. 4. Bilateral cervical adenopathy is likely reactive. 5. No focal inflammatory change or abscess within the anterior neck. No discrete etiology. This appears to be related to a superficial cellulitis. Electronically Signed   By: CSan MorelleM.D.   On: 04/02/2017 18:15    Assessment:   JHomer MillerMEldrick Penickis a 22y.o. male with a 7 year history of recurrent skin and soft tissue infections since the age of 89  He has had no formal work up for this and  cannot recall having cultures done in past.  He is from [place] but has lived in [place] since age 46. No unusual exposures or pets.  He has no fam hx of immunodeficiency. HIV test is negative. He does not have DM.   On imaging at [facility] he had CT scan with LUL granuloma.   He is not febrile and his wbc only mildly elevated.  Interesting case and his history suggests either an immunodeficiency such as Chronic granulomatous disease, or an unusual presentation of hidradenitis suppurativa.  Infection with an unusual mycobacteria or fungus would be unusual without underlying immune compromise.  Neutrophilic dermatosis would be less likely.  Neutrophil count is normal or elevated so not a neutropenic disorder.   He has had this for 7 years so unlikely to be related to malignancy or lymphoma.   Recommendations Continue unasyn and vanco pending culture Check IG levels, testing for CGD, check complement pathway I have swabbed and sent for AFB  Thank you very much for allowing me to participate in the care of this patient. Please call with questions.   Cheral Marker. Ola Spurr, MD

## 2017-04-03 NOTE — Care Management (Signed)
Met with patient and his girlfriend at bedside. Application given for medication management and [facility]. Email sent to both agencies. Instructions on taking the application to [facility] and his prescriptions prior to 4:30 pm given.  Patient appreciative.

## 2017-04-03 NOTE — Progress Notes (Signed)
[facility] [place] at [facility]   PATIENT NAME: Bryan ModeJesse Muro Montes    MR#:  [id number]  DATE OF BIRTH:  01/18/1996  SUBJECTIVE:  CHIEF COMPLAINT:  Pt is resting comfortably.  Denies any dysphagia.  Pain in the neck is manageable.  Reporting intermittent episodes of pain while urinating  REVIEW OF SYSTEMS:  CONSTITUTIONAL: No fever, fatigue or weakness.  EYES: No blurred or double vision.  EARS, NOSE, AND THROAT: No tinnitus or ear pain.  RESPIRATORY: No cough, shortness of breath, wheezing or hemoptysis.  CARDIOVASCULAR: No chest pain, orthopnea, edema.  GASTROINTESTINAL: No nausea, vomiting, diarrhea or abdominal pain.  GENITOURINARY: No dysuria but reporting dysuria intermittently.  Denies any penile discharge ENDOCRINE: No polyuria, nocturia,  HEMATOLOGY: No anemia, easy bruising or bleeding SKIN: No rash or lesion. MUSCULOSKELETAL: No joint pain or arthritis.   NEUROLOGIC: No tingling, numbness, weakness.  PSYCHIATRY: No anxiety or depression.   DRUG ALLERGIES:  No Known Allergies  VITALS:  Blood pressure (!) 118/57, pulse 72, temperature 98.3 F (36.8 C), temperature source Oral, resp. rate 18, height 5\' 11"  (1.803 m), weight 63.7 kg (140 lb 6.4 oz), SpO2 98 %.  PHYSICAL EXAMINATION:  GENERAL:  22 y.o.-year-old patient lying in the bed with no acute distress.  EYES: Pupils equal, round, reactive to light and accommodation. No scleral icterus. Extraocular muscles intact.  HEENT: Head atraumatic, normocephalic. Oropharynx and nasopharynx clear.  NECK:  Supple, no jugular venous distention. No thyroid enlargement, left lateral aspect of the neck status post drainage of the abscess and the drain is intact  lUNGS: Normal breath sounds bilaterally, no wheezing, rales,rhonchi or crepitation. No use of accessory muscles of respiration.  CARDIOVASCULAR: S1, S2 normal. No murmurs, rubs, or gallops.  ABDOMEN: Soft, nontender, nondistended. Bowel sounds  present. No organomegaly or mass.  EXTREMITIES: No pedal edema, cyanosis, or clubbing.  NEUROLOGIC: Cranial nerves II through XII are intact. Muscle strength 5/5 in all extremities. Sensation intact. Gait not checked.  PSYCHIATRIC: The patient is alert and oriented x 3.  SKIN: No obvious rash, lesion, or ulcer.    LABORATORY PANEL:   CBC Recent Labs  Lab 04/03/17 0426  WBC 12.7*  HGB 14.8  HCT 44.9  PLT 294   ------------------------------------------------------------------------------------------------------------------  Chemistries  Recent Labs  Lab 04/02/17 1727 04/03/17 0426  NA 137 136  K 4.5 3.5  CL 101 100*  CO2 28 27  GLUCOSE 107* 99  BUN 9 8  CREATININE 0.96 0.81  CALCIUM 8.9 8.7*  AST 33  --   ALT 35  --   ALKPHOS 99  --   BILITOT 0.8  --    ------------------------------------------------------------------------------------------------------------------  Cardiac Enzymes No results for input(s): TROPONINI in the last 168 hours. ------------------------------------------------------------------------------------------------------------------  RADIOLOGY:  Ct Soft Tissue Neck W Contrast  Result Date: 04/02/2017 CLINICAL DATA:  Posterior left neck abscess. EXAM: CT NECK WITH CONTRAST TECHNIQUE: Multidetector CT imaging of the neck was performed using the standard protocol following the bolus administration of intravenous contrast. CONTRAST:  75mL ISOVUE-300 IOPAMIDOL (ISOVUE-300) INJECTION 61% COMPARISON:  None. FINDINGS: Pharynx and larynx: No focal mucosal or submucosal lesions are present. Tonsils are within normal limits. The oropharynx, nasopharynx, and hypopharynx are unremarkable. The epiglottis is normal. Vocal cords are midline and symmetric. Salivary glands: The submandibular and parotid glands are within normal limits bilaterally. Thyroid: Normal Lymph nodes: Bilateral reactive type cervical lymph nodes are present in the level 2, level 3, and level 4  stations, left greater  than right. Vascular: Negative. Limited intracranial: Unremarkable. Visualized orbits: Within normal limits. Mastoids and visualized paranasal sinuses: The paranasal sinuses and mastoid air cells are clear. Skeleton: Vertebral body heights and alignment are normal. No focal lytic or blastic lesions are present. No definite disc disease is present. Upper chest: The lung apices are clear.  An azygos fissure is noted. Other: A complex peripherally enhancing fluid collection is present within the subcutaneous tissues of the left posterolateral neck. The area measures 6.5 x 4.4 x 2.2 cm. The collection extends to the deep cervical fascia and is inseparable from the left trapezius muscle. There is no definite extension beyond the deep cervical fascia. There is skin thickening and a smaller collection in the right posterolateral neck measuring 1.3 x 1.1 x 0.7 cm. IMPRESSION: 1. Complex subcutaneous peripherally enhancing fluid collection compatible with abscess in the left posterolateral neck measuring 6.5 x 4.4 x 2.2 cm extending to the deep cervical fascia without definite invasion into the deeper spaces. 2. The collection is inseparable from the superior aspect of the left trapezius muscle. 3. A smaller subcutaneous collection is also present in the right posterolateral neck measuring 1.1 cm, also concerning for infection. 4. Bilateral cervical adenopathy is likely reactive. 5. No focal inflammatory change or abscess within the anterior neck. No discrete etiology. This appears to be related to a superficial cellulitis. Electronically Signed   By: Marin Roberts M.D.   On: 04/02/2017 18:15    EKG:  No orders found for this or any previous visit.  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   All the records are reviewed and case discussed with Care Management/Social Workerr. Management plans discussed with the patient, family and they are in agreement.  CODE STATUS: fc  TOTAL TIME TAKING CARE OF THIS PATIENT: 35 minutes.   POSSIBLE D/C IN 1-2DAYS, DEPENDING ON CLINICAL CONDITION.  Note: This dictation was prepared with Dragon dictation along with smaller phrase technology. Any transcriptional errors that result from this process are unintentional.   Ramonita Lab M.D on 04/03/2017 at 4:21 PM  Between 7am to 6pm - Pager - [phone] After 6pm go to [email or web address] - password [email or web address] [facility]  [place] [facility]  Office  [phone]  CC: Primary care physician; [facility]

## 2017-04-03 NOTE — Progress Notes (Signed)
Pharmacy Antibiotic Note  Bryan SoursJesse Cordell Macky LowerMuro Montes is a 22 y.o. male admitted on 04/02/2017 with neck abscess.  Pharmacy has been consulted for vanc/unasyn dosing.  Plan: Patient received vanc 1g IV x 1  Will f/u w/ vanc 1g IV q8h w/ 6 hour stack dose Will draw vanc trough 1/3 @ 0500 prior to 4th dose. Will start unasyn 3g IV q6h  Ke 0.0946 T1/2 8 hrs Goal trough 15 - 20 mcg/mL  Height: 5\' 11"  (180.3 cm) Weight: 140 lb 6.4 oz (63.7 kg) IBW/kg (Calculated) : 75.3  Temp (24hrs), Avg:98.4 F (36.9 C), Min:98.3 F (36.8 C), Max:98.4 F (36.9 C)  Recent Labs  Lab 03/31/17 1108 04/02/17 1727  WBC 10.5 10.1  CREATININE 0.71 0.96    Estimated Creatinine Clearance: 108.7 mL/min (by C-G formula based on SCr of 0.96 mg/dL).    No Known Allergies   Thank you for allowing pharmacy to be a part of this patient's care.  Thomasene Rippleavid Celenia Hruska, PharmD, BCPS Clinical Pharmacist 04/03/2017

## 2017-04-04 ENCOUNTER — Other Ambulatory Visit: Payer: Self-pay

## 2017-04-04 LAB — BASIC METABOLIC PANEL
Anion gap: 8 (ref 5–15)
BUN: 10 mg/dL (ref 6–20)
CHLORIDE: 103 mmol/L (ref 101–111)
CO2: 27 mmol/L (ref 22–32)
CREATININE: 0.68 mg/dL (ref 0.61–1.24)
Calcium: 8.9 mg/dL (ref 8.9–10.3)
GFR calc Af Amer: >60 mL/min (ref >60)
GFR calc non Af Amer: >60 mL/min (ref >60)
Glucose, Bld: 103 mg/dL — ABNORMAL HIGH (ref 65–99)
Potassium: 4.3 mmol/L (ref 3.5–5.1)
Sodium: 138 mmol/L (ref 135–145)

## 2017-04-04 LAB — SEDIMENTATION RATE: Sed Rate: 35 mm/hr — ABNORMAL HIGH (ref 0–15)

## 2017-04-04 LAB — CBC
HEMATOCRIT: 41.1 % (ref 40.0–52.0)
Hemoglobin: 13.8 g/dL (ref 13.0–18.0)
MCH: 27.5 pg (ref 26.0–34.0)
MCHC: 33.6 g/dL (ref 32.0–36.0)
MCV: 82.1 fL (ref 80.0–100.0)
Platelets: 256 10*3/uL (ref 150–440)
RBC: 5.01 MIL/uL (ref 4.40–5.90)
RDW: 13.4 % (ref 11.5–14.5)
WBC: 7.4 10*3/uL (ref 3.8–10.6)

## 2017-04-04 LAB — CYTOLOGY - NON PAP

## 2017-04-04 LAB — URINE CULTURE
Culture: NO GROWTH
Special Requests: NORMAL

## 2017-04-04 LAB — HIV ANTIBODY (ROUTINE TESTING W REFLEX): HIV SCREEN 4TH GENERATION: NONREACTIVE

## 2017-04-04 LAB — VANCOMYCIN, TROUGH: VANCOMYCIN TR: 15 ug/mL (ref 15–20)

## 2017-04-04 NOTE — Progress Notes (Signed)
[facility] [place] [facility]   PATIENT NAME: Bryan Montes    MR#:  [id number]  DATE OF BIRTH:  08/19/1995  SUBJECTIVE:  CHIEF COMPLAINT:  Pt is resting comfortably.  Reporting another swelling developed next to the current abscess and drained.   patient does not feel comfortable to go home with drain,wanna talk to ENT  REVIEW OF SYSTEMS:  CONSTITUTIONAL: No fever, fatigue or weakness.  EYES: No blurred or double vision.  EARS, NOSE, AND THROAT: No tinnitus or ear pain.  RESPIRATORY: No cough, shortness of breath, wheezing or hemoptysis.  CARDIOVASCULAR: No chest pain, orthopnea, edema.  GASTROINTESTINAL: No nausea, vomiting, diarrhea or abdominal pain.  GENITOURINARY: No dysuria but reporting dysuria intermittently.  Denies any penile discharge ENDOCRINE: No polyuria, nocturia,  HEMATOLOGY: No anemia, easy bruising or bleeding SKIN: No rash or lesion. MUSCULOSKELETAL: No joint pain or arthritis.   NEUROLOGIC: No tingling, numbness, weakness.  PSYCHIATRY: No anxiety or depression.   DRUG ALLERGIES:  No Known Allergies  VITALS:  Blood pressure 111/63, pulse 60, temperature 97.8 F (36.6 C), temperature source Oral, resp. rate 18, height 5\' 11"  (1.803 m), weight 63.7 kg (140 lb 6.4 oz), SpO2 98 %.  PHYSICAL EXAMINATION:  GENERAL:  22 y.o.-year-old patient lying in the bed with no acute distress.  EYES: Pupils equal, round, reactive to light and accommodation. No scleral icterus. Extraocular muscles intact.  HEENT: Head atraumatic, normocephalic. Oropharynx and nasopharynx clear.  NECK:  Supple, no jugular venous distention. No thyroid enlargement, left lateral aspect of the neck status post drainage of the abscess and the drain is intact , 3-4 small non-fluctuating swellings were noticed on the back of the neck lUNGS: Normal breath sounds bilaterally, no wheezing, rales,rhonchi or crepitation. No use of accessory muscles of respiration.   CARDIOVASCULAR: S1, S2 normal. No murmurs, rubs, or gallops.  ABDOMEN: Soft, nontender, nondistended. Bowel sounds present. No organomegaly or mass.  EXTREMITIES: No pedal edema, cyanosis, or clubbing.  NEUROLOGIC: Cranial nerves II through XII are intact. Muscle strength 5/5 in all extremities. Sensation intact. Gait not checked.  PSYCHIATRIC: The patient is alert and oriented x 3.  SKIN: No obvious rash, lesion, or ulcer.    LABORATORY PANEL:   CBC Recent Labs  Lab 04/04/17 0416  WBC 7.4  HGB 13.8  HCT 41.1  PLT 256   ------------------------------------------------------------------------------------------------------------------  Chemistries  Recent Labs  Lab 04/02/17 1727  04/04/17 0416  NA 137   < > 138  K 4.5   < > 4.3  CL 101   < > 103  CO2 28   < > 27  GLUCOSE 107*   < > 103*  BUN 9   < > 10  CREATININE 0.96   < > 0.68  CALCIUM 8.9   < > 8.9  AST 33  --   --   ALT 35  --   --   ALKPHOS 99  --   --   BILITOT 0.8  --   --    < > = values in this interval not displayed.   ------------------------------------------------------------------------------------------------------------------  Cardiac Enzymes No results for input(s): TROPONINI in the last 168 hours. ------------------------------------------------------------------------------------------------------------------  RADIOLOGY:  Ct Soft Tissue Neck W Contrast  Result Date: 04/02/2017 CLINICAL DATA:  Posterior left neck abscess. EXAM: CT NECK WITH CONTRAST TECHNIQUE: Multidetector CT imaging of the neck was performed using the standard protocol following the bolus administration of intravenous contrast. CONTRAST:  75mL ISOVUE-300 IOPAMIDOL (ISOVUE-300) INJECTION  61% COMPARISON:  None. FINDINGS: Pharynx and larynx: No focal mucosal or submucosal lesions are present. Tonsils are within normal limits. The oropharynx, nasopharynx, and hypopharynx are unremarkable. The epiglottis is normal. Vocal cords are  midline and symmetric. Salivary glands: The submandibular and parotid glands are within normal limits bilaterally. Thyroid: Normal Lymph nodes: Bilateral reactive type cervical lymph nodes are present in the level 2, level 3, and level 4 stations, left greater than right. Vascular: Negative. Limited intracranial: Unremarkable. Visualized orbits: Within normal limits. Mastoids and visualized paranasal sinuses: The paranasal sinuses and mastoid air cells are clear. Skeleton: Vertebral body heights and alignment are normal. No focal lytic or blastic lesions are present. No definite disc disease is present. Upper chest: The lung apices are clear.  An azygos fissure is noted. Other: A complex peripherally enhancing fluid collection is present within the subcutaneous tissues of the left posterolateral neck. The area measures 6.5 x 4.4 x 2.2 cm. The collection extends to the deep cervical fascia and is inseparable from the left trapezius muscle. There is no definite extension beyond the deep cervical fascia. There is skin thickening and a smaller collection in the right posterolateral neck measuring 1.3 x 1.1 x 0.7 cm. IMPRESSION: 1. Complex subcutaneous peripherally enhancing fluid collection compatible with abscess in the left posterolateral neck measuring 6.5 x 4.4 x 2.2 cm extending to the deep cervical fascia without definite invasion into the deeper spaces. 2. The collection is inseparable from the superior aspect of the left trapezius muscle. 3. A smaller subcutaneous collection is also present in the right posterolateral neck measuring 1.1 cm, also concerning for infection. 4. Bilateral cervical adenopathy is likely reactive. 5. No focal inflammatory change or abscess within the anterior neck. No discrete etiology. This appears to be related to a superficial cellulitis. Electronically Signed   By: Marin Roberts M.D.   On: 04/02/2017 18:15    EKG:  No orders found for this or any previous  visit.  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  HIV ruled out  #Dysuria  Urinalysis normal urine culture is pending    #Leukocytosis secondary to neck abscess and status post drainage Improving.  WBC is normal at 7.4 today   All the records are reviewed and case discussed with Care Management/Social Workerr. Management plans discussed with the patient, family and they are in agreement.  CODE STATUS: fc  TOTAL TIME TAKING CARE OF THIS PATIENT: 30 minutes.   POSSIBLE D/C IN 1-2DAYS, DEPENDING ON CLINICAL CONDITION.  Note: This dictation was prepared with Dragon dictation along with smaller phrase technology. Any transcriptional errors that result from this process are unintentional.   Ramonita Lab M.D on 04/04/2017 at 2:23 PM  Between 7am to 6pm - Pager - [phone] After 6pm go to [email or web address] - password [email or web address] [facility]  [place] [facility]  Office  [phone]  CC: Primary care physician; [facility]

## 2017-04-04 NOTE — Progress Notes (Signed)
[facility] INFECTIOUS DISEASE PROGRESS NOTE Date of Admission:  04/02/2017     ID: Bryan Montes is a 22 y.o. male with neck abscess Principal Problem:   Neck abscess  Subjective: No fevers, still some neck swelling  ROS  Eleven systems are reviewed and negative except per hpi  Medications:  Antibiotics Given (last 72 hours)    Date/Time Action Medication Dose Rate   04/03/17 0037 New Bag/Given   vancomycin (VANCOCIN) IVPB 1000 mg/200 mL premix 1,000 mg 200 mL/hr   04/03/17 0037 New Bag/Given   Ampicillin-Sulbactam (UNASYN) 3 g in sodium chloride 0.9 % 100 mL IVPB 3 g 200 mL/hr   04/03/17 0525 New Bag/Given   Ampicillin-Sulbactam (UNASYN) 3 g in sodium chloride 0.9 % 100 mL IVPB 3 g 200 mL/hr   04/03/17 0814 New Bag/Given   vancomycin (VANCOCIN) IVPB 1000 mg/200 mL premix 1,000 mg 200 mL/hr   04/03/17 1206 New Bag/Given  [Med not available from pharmacy at scheculed time resent  x2 per Karen]   Ampicillin-Sulbactam (UNASYN) 3 g in sodium chloride 0.9 % 100 mL IVPB 3 g 200 mL/hr   04/03/17 1510 New Bag/Given   vancomycin (VANCOCIN) IVPB 1000 mg/200 mL premix 1,000 mg 200 mL/hr   04/03/17 1739 New Bag/Given   Ampicillin-Sulbactam (UNASYN) 3 g in sodium chloride 0.9 % 100 mL IVPB 3 g 200 mL/hr   04/03/17 2029 New Bag/Given   vancomycin (VANCOCIN) IVPB 1000 mg/200 mL premix 1,000 mg 200 mL/hr   04/03/17 2301 New Bag/Given   Ampicillin-Sulbactam (UNASYN) 3 g in sodium chloride 0.9 % 100 mL IVPB 3 g 200 mL/hr   04/04/17 0432 New Bag/Given   Ampicillin-Sulbactam (UNASYN) 3 g in sodium chloride 0.9 % 100 mL IVPB 3 g 200 mL/hr   04/04/17 0537 New Bag/Given   vancomycin (VANCOCIN) IVPB 1000 mg/200 mL premix 1,000 mg 100 mL/hr   04/04/17 1136 New Bag/Given   Ampicillin-Sulbactam (UNASYN) 3 g in sodium chloride 0.9 % 100 mL IVPB 3 g 200 mL/hr     . enoxaparin (LOVENOX) injection  40 mg Subcutaneous Q24H    Objective: Vital signs in last 24 hours: Temp:  [97.6 F  (36.4 C)-98.8 F (37.1 C)] 97.8 F (36.6 C) (01/03 0715) Pulse Rate:  [56-65] 60 (01/03 0715) Resp:  [18-19] 18 (01/03 0715) BP: (108-117)/(58-63) 111/63 (01/03 0715) SpO2:  [98 %-100 %] 98 % (01/03 0715) Constitutional: He is oriented to person, place, and time. He appears thin No distress.  HENT: anicteric, L neck with Penrose drain in place from recent I and D with ss drainage Mouth/Throat: Oropharynx is clear and moist. No oropharyngeal exudate.  Cardiovascular: Normal rate, regular rhythm and normal heart sounds. Exam reveals no gallop and no friction rub.  Pulmonary/Chest: Effort normal and breath sounds normal. No respiratory distress. He has no wheezes.  Abdominal: Soft. Bowel sounds are normal. He exhibits no distension. There is no tenderness.  Lymphadenopathy: He has noshoddy  cervical adenopathy.  Neurological: He is alert and oriented to person, place, and time.  Skin: mult healed scars on back, and buttock, scars on face as well. Psychiatric: He has a normal mood and affect. His behavior is normal.     Lab Results Recent Labs    04/03/17 0426 04/04/17 0416  WBC 12.7* 7.4  HGB 14.8 13.8  HCT 44.9 41.1  NA 136 138  K 3.5 4.3  CL 100* 103  CO2 27 27  BUN 8 10  CREATININE 0.81 0.68  Microbiology: Results for orders placed or performed during the hospital encounter of 04/02/17  Aerobic/Anaerobic Culture (surgical/deep wound)     Status: None (Preliminary result)   Collection Time: 04/02/17  8:56 PM  Result Value Ref Range Status   Specimen Description   Final    NECK Performed at [facility], [address] [place], [place] [address]    Special Requests   Final    NONE Performed at [facility], [address] [place], [place] [address]    Gram Stain   Final    FEW WBC PRESENT, PREDOMINANTLY PMN RARE GRAM VARIABLE ROD    Culture   Final    NO GROWTH 1 DAY Performed at [facility], [address], [place], [place]  [address]    Report Status PENDING  Incomplete    Studies/Results: Ct Soft Tissue Neck W Contrast  Result Date: 04/02/2017 CLINICAL DATA:  Posterior left neck abscess. EXAM: CT NECK WITH CONTRAST TECHNIQUE: Multidetector CT imaging of the neck was performed using the standard protocol following the bolus administration of intravenous contrast. CONTRAST:  75mL ISOVUE-300 IOPAMIDOL (ISOVUE-300) INJECTION 61% COMPARISON:  None. FINDINGS: Pharynx and larynx: No focal mucosal or submucosal lesions are present. Tonsils are within normal limits. The oropharynx, nasopharynx, and hypopharynx are unremarkable. The epiglottis is normal. Vocal cords are midline and symmetric. Salivary glands: The submandibular and parotid glands are within normal limits bilaterally. Thyroid: Normal Lymph nodes: Bilateral reactive type cervical lymph nodes are present in the level 2, level 3, and level 4 stations, left greater than right. Vascular: Negative. Limited intracranial: Unremarkable. Visualized orbits: Within normal limits. Mastoids and visualized paranasal sinuses: The paranasal sinuses and mastoid air cells are clear. Skeleton: Vertebral body heights and alignment are normal. No focal lytic or blastic lesions are present. No definite disc disease is present. Upper chest: The lung apices are clear.  An azygos fissure is noted. Other: A complex peripherally enhancing fluid collection is present within the subcutaneous tissues of the left posterolateral neck. The area measures 6.5 x 4.4 x 2.2 cm. The collection extends to the deep cervical fascia and is inseparable from the left trapezius muscle. There is no definite extension beyond the deep cervical fascia. There is skin thickening and a smaller collection in the right posterolateral neck measuring 1.3 x 1.1 x 0.7 cm. IMPRESSION: 1. Complex subcutaneous peripherally enhancing fluid collection compatible with abscess in the left posterolateral neck measuring 6.5 x 4.4 x 2.2 cm  extending to the deep cervical fascia without definite invasion into the deeper spaces. 2. The collection is inseparable from the superior aspect of the left trapezius muscle. 3. A smaller subcutaneous collection is also present in the right posterolateral neck measuring 1.1 cm, also concerning for infection. 4. Bilateral cervical adenopathy is likely reactive. 5. No focal inflammatory change or abscess within the anterior neck. No discrete etiology. This appears to be related to a superficial cellulitis. Electronically Signed   By: Marin Roberts M.D.   On: 04/02/2017 18:15    Assessment/Plan: Bryan Montes is a 22 y.o. male with a 7 year history of recurrent skin and soft tissue infections since the age of 63.  He has had no formal work up for this and cannot recall having cultures done in past.  He is from [place] but has lived in [place] since age 41. No unusual exposures or pets.  He has no fam hx of immunodeficiency. HIV test is negative. He does not  have DM.   On imaging at [facility] he had CT scan with LUL granuloma.   He is not febrile and his wbc only mildly elevated.  Interesting case and his history suggests either an immunodeficiency such as Chronic granulomatous disease, or an unusual presentation of hidradenitis suppurativa.  Infection with an unusual mycobacteria or fungus would be unusual without underlying immune compromise.  Neutrophilic dermatosis would be less likely.  Neutrophil count is normal or elevated so not a neutropenic disorder.  He has had this for 7 years so unlikely to be related to malignancy or lymphoma.   Recommendations Continue unasyn and vanco pending culture Pending IG levels, testing for CGD, check complement pathway I have swabbed and sent for AFB Can dc home on augmetin if stable tomorrow.    Thank you very much for the consult. Will follow with you.  Mick Sell   04/04/2017, 2:36 PM

## 2017-04-04 NOTE — Progress Notes (Signed)
Pharmacy Antibiotic Note  Bryan Montes is a 22 y.o. male admitted on 04/02/2017 with neck abscess.  Pharmacy has been consulted for vancomycin and ampicillin/sulbactam dosing.  ID is following.  Plan: VT = 15 mcg/mL is therapeutic. Continue vancomycin 1000 mg IV q8h Goal VT 15-20 mcg/mL  Continue ampicillin/sulbactam 3 g IV q6h  Height: 5\' 11"  (180.3 cm) Weight: 140 lb 6.4 oz (63.7 kg) IBW/kg (Calculated) : 75.3  Temp (24hrs), Avg:98.1 F (36.7 C), Min:97.6 F (36.4 C), Max:98.8 F (37.1 C)  Recent Labs  Lab 03/31/17 1108 04/02/17 1727 04/03/17 0426 04/04/17 0416 04/04/17 1325  WBC 10.5 10.1 12.7* 7.4  --   CREATININE 0.71 0.96 0.81 0.68  --   VANCOTROUGH  --   --   --   --  15    Estimated Creatinine Clearance: 130.5 mL/min (by C-G formula based on SCr of 0.68 mg/dL).    No Known Allergies   Thank you for allowing pharmacy to be a part of this patient's care.  Cindi CarbonMary M Sedric Guia, PharmD, BCPS Clinical Pharmacist 04/04/2017

## 2017-04-04 NOTE — Consult Note (Signed)
..   04/04/2017 5:41 PM  [name], [name] [id number]  Post-Op Day 2    Temp:  [97.6 F (36.4 C)-98.8 F (37.1 C)] 97.8 F (36.6 C) (01/03 0715) Pulse Rate:  [56-65] 60 (01/03 0715) Resp:  [18-19] 18 (01/03 0715) BP: (108-117)/(58-63) 111/63 (01/03 0715) SpO2:  [98 %-100 %] 98 % (01/03 0715),     Intake/Output Summary (Last 24 hours) at 04/04/2017 1741 Last data filed at 04/04/2017 0426 Gross per 24 hour  Intake 240 ml  Output 600 ml  Net -360 ml    Results for orders placed or performed during the hospital encounter of 04/02/17 (from the past 24 hour(s))  CBC     Status: None   Collection Time: 04/04/17  4:16 AM  Result Value Ref Range   WBC 7.4 3.8 - 10.6 K/uL   RBC 5.01 4.40 - 5.90 MIL/uL   Hemoglobin 13.8 13.0 - 18.0 g/dL   HCT 40.941.1 81.140.0 - 91.452.0 %   MCV 82.1 80.0 - 100.0 fL   MCH 27.5 26.0 - 34.0 pg   MCHC 33.6 32.0 - 36.0 g/dL   RDW 78.213.4 95.611.5 - 21.314.5 %   Platelets 256 150 - 440 K/uL  Basic metabolic panel     Status: Abnormal   Collection Time: 04/04/17  4:16 AM  Result Value Ref Range   Sodium 138 135 - 145 mmol/L   Potassium 4.3 3.5 - 5.1 mmol/L   Chloride 103 101 - 111 mmol/L   CO2 27 22 - 32 mmol/L   Glucose, Bld 103 (H) 65 - 99 mg/dL   BUN 10 6 - 20 mg/dL   Creatinine, Ser 0.860.68 0.61 - 1.24 mg/dL   Calcium 8.9 8.9 - 57.810.3 mg/dL   GFR calc non Af Amer >60 >60 mL/min   GFR calc Af Amer >60 >60 mL/min   Anion gap 8 5 - 15  Sedimentation rate     Status: Abnormal   Collection Time: 04/04/17  4:16 AM  Result Value Ref Range   Sed Rate 35 (H) 0 - 15 mm/hr  Vancomycin, trough     Status: None   Collection Time: 04/04/17  1:25 PM  Result Value Ref Range   Vancomycin Tr 15 15 - 20 ug/mL    SUBJECTIVE:  Patient reports continued neck pain.  Concerned regarding small "bumps" adjacent to previous I&D site.  Reports some improvement in neck pain and drainage.  OBJECTIVE:  GEN-  NAD, supine in bed EARS-  Left preauricular lymph node, non-fluctuant but  tender NOSE- clear anteriorly NECK-  Large left posterior induration and swelling, pinrose in place and removed.  No frank purulence expressed.  No flucutance on left neck.  Small amount of fluctuance near right posterior neck I&D site but soft and no induration  Culture-  PMN, rare gram variable rods, no growth  IMPRESSION:  S/p I&D of multiple neck abscesses of posterior neck POD#2  PLAN:  Patient I&D'd by Dr. Gershon Crane'Connell on 04/02/18.  Please see his consult note for details at that time.  Consulted for further follow up.  Drain removed.  Improved exam from CT and ENT notes from 1/20.  ID following for possible atypical infection/granulomatous disease.  Agree not likely lymphoma.  Continue abx and will continue to follow culture results.  Bryan Montes 04/04/2017, 5:41 PM

## 2017-04-04 NOTE — Progress Notes (Signed)
Clinical Education officer, museum (CSW) received a call from [facility] and she stated that she met with patient yesterday and he does not meet criteria for medicaid or disability.   [employer], LCSW [phone]

## 2017-04-05 LAB — MISC LABCORP TEST (SEND OUT): Labcorp test code: 9985

## 2017-04-05 LAB — CULTURE, BLOOD (ROUTINE X 2)
CULTURE: NO GROWTH
CULTURE: NO GROWTH
Special Requests: ADEQUATE

## 2017-04-05 LAB — IGG, IGA, IGM
IGA: 375 mg/dL (ref 90–386)
IGG (IMMUNOGLOBIN G), SERUM: 1097 mg/dL (ref 700–1600)
IGM (IMMUNOGLOBULIN M), SRM: 41 mg/dL (ref 20–172)

## 2017-04-05 LAB — COMPLEMENT, TOTAL: Compl, Total (CH50): >60 U/mL (ref >41)

## 2017-04-05 MED ORDER — AMOXICILLIN-POT CLAVULANATE 875-125 MG PO TABS: 1.0000 | ORAL_TABLET | Freq: Two times a day (BID) | ORAL | Status: DC

## 2017-04-05 MED ORDER — DOXYCYCLINE HYCLATE 100 MG PO CAPS: 100.0000 mg | ORAL_CAPSULE | Freq: Two times a day (BID) | ORAL | 0 refills | Status: DC

## 2017-04-05 MED ORDER — LIDOCAINE-EPINEPHRINE 1 %-1:100000 IJ SOLN
10.0000 mL | INTRAMUSCULAR | Status: DC
  Filled 2017-04-05: qty 10

## 2017-04-05 MED ORDER — GENTAMICIN SULFATE 0.1 % EX OINT: TOPICAL_OINTMENT | Freq: Three times a day (TID) | CUTANEOUS | 0 refills | Status: DC

## 2017-04-05 MED ORDER — GENTAMICIN SULFATE 0.1 % EX OINT
TOPICAL_OINTMENT | Freq: Three times a day (TID) | CUTANEOUS | Status: DC
  Filled 2017-04-05: qty 15

## 2017-04-05 MED ORDER — AMOXICILLIN-POT CLAVULANATE 875-125 MG PO TABS: 1.0000 | ORAL_TABLET | Freq: Two times a day (BID) | ORAL | 0 refills | Status: DC

## 2017-04-05 MED ORDER — ACETAMINOPHEN 325 MG PO TABS: 650.0000 mg | ORAL_TABLET | Freq: Four times a day (QID) | ORAL | Status: DC | PRN

## 2017-04-05 NOTE — Progress Notes (Signed)
Pharmacy Antibiotic Note  Bryan Montes is a 22 y.o. male admitted on 04/02/2017 with neck abscess.  Pharmacy has been consulted for vancomycin and ampicillin/sulbactam dosing.  ID is following.This is day #3 of IV antibiotics.  Plan: Continue vancomycin 1000 mg IV q8h Goal VT 15-20 mcg/mL VT = 15 mcg/mL on 1/3. Will recheck VT on 1/5 to confirm regimen is still therapeutic  Continue ampicillin/sulbactam 3 g IV q6h  Height: 5\' 11"  (180.3 cm) Weight: 140 lb 6.4 oz (63.7 kg) IBW/kg (Calculated) : 75.3  Temp (24hrs), Avg:98.2 F (36.8 C), Min:98.1 F (36.7 C), Max:98.3 F (36.8 C)  Recent Labs  Lab 03/31/17 1108 04/02/17 1727 04/03/17 0426 04/04/17 0416 04/04/17 1325  WBC 10.5 10.1 12.7* 7.4  --   CREATININE 0.71 0.96 0.81 0.68  --   VANCOTROUGH  --   --   --   --  15    Estimated Creatinine Clearance: 130.5 mL/min (by C-G formula based on SCr of 0.68 mg/dL).    No Known Allergies   Micro: 1/2 Urine: No growth fina 1/1 wound: Pending 1/2 AFB: Sent  Antimicrobials: Ampicillin/sulbactam 1/2 >> Vancomycin 1/2 >>  Thank you for allowing pharmacy to be a part of this patient's care.  Cindi CarbonMary M Joscelyn Hardrick, PharmD, BCPS Clinical Pharmacist 04/05/2017

## 2017-04-05 NOTE — Progress Notes (Signed)
[facility]         [place], [place]   04/05/2017  Patient: Bryan Montes   Date of Birth:  02/06/1996  Date of admission:  04/02/2017  Date of Discharge  04/05/2017    To Whom it May Concern:   Bryan Montes  may return to work on 04/08/17.  PHYSICAL ACTIVITY:  Full  If you have any questions or concerns, please don't hesitate to call.  Sincerely,   Ramonita LabGouru, Cathan Gearin M.D Pager [phone] Office : [phone]   .

## 2017-04-05 NOTE — Discharge Instructions (Signed)
Follow-up of probable [facility]/ pcp in a week Follow-up with ENT on January 10 Follow-up with infectious disease in 2 weeks

## 2017-04-05 NOTE — Progress Notes (Signed)
..   04/05/2017 12:04 PM  Bryan Montes, [name] [id number]  Post-Op Day 3    Temp:  [98.1 F (36.7 C)-98.3 F (36.8 C)] 98.3 F (36.8 C) (01/04 0845) Pulse Rate:  [60-66] 60 (01/04 0845) Resp:  [14-18] 18 (01/04 0845) BP: (100-111)/(42-63) 111/63 (01/04 0845) SpO2:  [98 %-99 %] 98 % (01/04 0845),     Intake/Output Summary (Last 24 hours) at 04/05/2017 1204 Last data filed at 04/05/2017 0540 Gross per 24 hour  Intake 2340 ml  Output 475 ml  Net 1865 ml    Results for orders placed or performed during the hospital encounter of 04/02/17 (from the past 24 hour(s))  Vancomycin, trough     Status: None   Collection Time: 04/04/17  1:25 PM  Result Value Ref Range   Vancomycin Tr 15 15 - 20 ug/mL    SUBJECTIVE:    OBJECTIVE:  GEN-  NAd, supine in bed NECK-  Continued firmness to palpation.  Right sided flucutance slightly increased.  Left improved substantially  Procedure:  Incision and Drainage of right sided posterior neck abscess.  After verbal consent obtained, patient's right neck anesthetized with 2ml of 1% xylocaine with 1:100,000 epinepthrine.  Using a 18 gauge needle, the right neck previous I&D site was opened with keritin appearing debris expressed from abscess.  Patient tolerated procedure well.   IMPRESSION:  S/p I&D of bilateral neck abscesses POD#3, POD#0  PLAN:  Patient's right neck abscess that was drained today more consistent with infected epidermoid inclusion cyst.  Reviewed prior pathology and history.  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  Will see back on 04/11/2017.  Recommend MRSA coverage despite negative cultures given history.  Will place on Gentamicin ointment to put on I&D sites.  Nicki Furlan 04/05/2017, 12:04 PM

## 2017-04-05 NOTE — Discharge Summary (Signed)
[facility] [place] [facility]   PATIENT NAME: Safwan Tomei    MR#:  [id number]  DATE OF BIRTH:  1995/07/23  DATE OF ADMISSION:  04/02/2017 ADMITTING PHYSICIAN: Oralia Manis, MD  DATE OF DISCHARGE:  04/05/17  PRIMARY CARE PHYSICIAN: [place], [facility]    ADMISSION DIAGNOSIS:  Abscess [L02.91]  DISCHARGE DIAGNOSIS:  Principal Problem:   Neck abscess   SECONDARY DIAGNOSIS:   Past Medical History:  Diagnosis Date  . Acute prostatitis     HOSPITAL COURSE:   HPI  Yader Criger  is a 22 y.o. male who presents with longtime history of recurring abscesses.  Patient states that for the past 7 years he has been getting abscesses on his face, neck, as well as back and buttocks.  He states that recently they have not been as bad on his back and buttocks but he keeps getting recurrent episodes of what sound like folliculitis on his neck.  He came in tonight and was seen by ENT who drained the abscesses.  There is no evidence of deep tissue involvement in these abscesses on CT imaging, however ENT did leave a drain in on the left side of his neck.  Hospitalist were called for admission.  Of note, the patient had started on some IV antibiotics for these abscesses and corresponding cellulitis, but these oral antibiotics were not effective.  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 Andee Poles Treated with  broad-spectrum IV antibiotics Unasyn and vancomycin Wound cultures are neg  ID consult placed , appreciate recommendations ,  recommending to discharge patient with p.o. Augmentin and doxycycline for a total of 14-day course from the start of antibiotics Gold quant test for TB is pending.  HIV ruled out Outpatient follow-up with infectious disease trying to rule out atypical infection  #Dysuria  Urinalysis normal urine culture is pending    #Leukocytosis secondary to neck abscess and status post drainage Improving.  WBC is normal at 7.4 today    DISCHARGE CONDITIONS:    stable  CONSULTS OBTAINED:  Treatment Team:  Mick Sell, MD   PROCEDURES   DRUG ALLERGIES:  No Known Allergies  DISCHARGE MEDICATIONS:   Allergies as of 04/05/2017   No Known Allergies     Medication List    STOP taking these medications   clindamycin 300 MG capsule Commonly known as:  CLEOCIN     TAKE these medications   acetaminophen 325 MG tablet Commonly known as:  TYLENOL Take 2 tablets (650 mg total) by mouth every 6 (six) hours as needed for mild pain (or Fever >/= 101).   amoxicillin-clavulanate 875-125 MG tablet Commonly known as:  AUGMENTIN Take 1 tablet by mouth every 12 (twelve) hours.   doxycycline 100 MG capsule Commonly known as:  VIBRAMYCIN Take 1 capsule (100 mg total) by mouth 2 (two) times daily.   gentamicin ointment 0.1 % Commonly known as:  GARAMYCIN Apply topically 3 (three) times daily.   ibuprofen 200 MG tablet Commonly known as:  ADVIL,MOTRIN Take 200-400 mg by mouth every 6 (six) hours as needed.  multivitamin with minerals Tabs tablet Take 1 tablet by mouth daily.        DISCHARGE INSTRUCTIONS:   Follow-up of probable [facility] /pcp in a week Follow-up with ENT on January 10 Follow-up with infectious disease in 2 weeks  DIET:  Regular diet  DISCHARGE CONDITION:  Stable  ACTIVITY:  Activity as tolerated  OXYGEN:  Home Oxygen: No.   Oxygen Delivery: room air  DISCHARGE LOCATION:  home   If you experience worsening of your admission symptoms,  develop shortness of breath, life threatening emergency, suicidal or homicidal thoughts you must seek medical attention immediately by calling 911 or calling your MD immediately  if symptoms less severe.  You Must read complete instructions/literature along with all the possible adverse reactions/side effects for all the Medicines you take and that have been prescribed to you. Take any new Medicines after you have completely understood and accpet all the possible adverse reactions/side effects.   Please note  You were cared for by a hospitalist during your hospital stay. If you have any questions about your discharge medications or the care you received while you were in the hospital after you are discharged, you can call the unit and asked to speak with the hospitalist on call if the hospitalist that took care of you is not available. Once you are discharged, your primary care physician will handle any further medical issues. Please note that NO REFILLS for any discharge medications will be authorized once you are discharged, as it is imperative that you return to your primary care physician (or establish a relationship with a primary care physician if you do not have one) for your aftercare needs so that they can reassess your need for medications and monitor your lab values.     Today  Chief Complaint  Patient presents with  . Abscess   Patient is doing fine.  Had I&D of the right lateral neck abscess today by ENT.  Okay to discharge patient from ENT and ID standpoint  ROS:  CONSTITUTIONAL: Denies fevers, chills. Denies any fatigue, weakness.  EYES: Denies blurry vision, double vision, eye pain. EARS, NOSE, THROAT: Denies tinnitus, ear pain, hearing loss. RESPIRATORY: Denies cough, wheeze, shortness of breath.  CARDIOVASCULAR: Denies chest pain, palpitations, edema.  GASTROINTESTINAL: Denies nausea, vomiting, diarrhea, abdominal pain. Denies bright red blood per rectum. GENITOURINARY:  Denies dysuria, hematuria. ENDOCRINE: Denies nocturia or thyroid problems. HEMATOLOGIC AND LYMPHATIC: Denies easy bruising or bleeding. SKIN: Denies rash or lesion. MUSCULOSKELETAL: Denies pain in neck, back, shoulder, knees, hips or arthritic symptoms.  NEUROLOGIC: Denies paralysis, paresthesias.  PSYCHIATRIC: Denies anxiety or depressive symptoms.   VITAL SIGNS:  Blood pressure 111/63, pulse 60, temperature 98.3 F (36.8 C), temperature source Oral, resp. rate 18, height 5\' 11"  (1.803 m), weight 63.7 kg (140 lb 6.4 oz), SpO2 98 %.  I/O:    Intake/Output Summary (Last 24 hours) at 04/05/2017 1322 Last data filed at 04/05/2017 0540 Gross per 24 hour  Intake 2340 ml  Output 475 ml  Net 1865 ml    PHYSICAL EXAMINATION:  GENERAL:  22 y.o.-year-old patient lying in the bed with no acute distress.  EYES: Pupils equal, round, reactive to light and accommodation. No scleral icterus. Extraocular muscles intact.  HEENT: Head atraumatic, normocephalic. Oropharynx and nasopharynx clear.  NECK:  Supple, no jugular venous distention.  Right side of the neck status post incision and drainage of the abscess.  Drain removed LUNGS: Normal breath sounds bilaterally, no wheezing, rales,rhonchi or crepitation.  No use of accessory muscles of respiration.  CARDIOVASCULAR: S1, S2 normal. No murmurs, rubs, or gallops.  ABDOMEN: Soft, non-tender, non-distended. Bowel sounds present. No organomegaly or mass.  EXTREMITIES: No pedal edema, cyanosis, or clubbing.  NEUROLOGIC: Cranial nerves II through XII are intact. Muscle strength 5/5 in all extremities. Sensation intact. Gait not checked.  PSYCHIATRIC: The patient is alert and oriented x 3.  SKIN: No obvious rash, lesion, or ulcer.   DATA REVIEW:   CBC Recent Labs  Lab 04/04/17 0416  WBC 7.4  HGB 13.8  HCT 41.1  PLT 256    Chemistries  Recent Labs  Lab 04/02/17 1727  04/04/17 0416  NA 137   < > 138  K 4.5   < > 4.3  CL 101   < > 103  CO2  28   < > 27  GLUCOSE 107*   < > 103*  BUN 9   < > 10  CREATININE 0.96   < > 0.68  CALCIUM 8.9   < > 8.9  AST 33  --   --   ALT 35  --   --   ALKPHOS 99  --   --   BILITOT 0.8  --   --    < > = values in this interval not displayed.    Cardiac Enzymes No results for input(s): TROPONINI in the last 168 hours.  Microbiology Results  Results for orders placed or performed during the hospital encounter of 04/02/17  Aerobic/Anaerobic Culture (surgical/deep wound)     Status: None (Preliminary result)   Collection Time: 04/02/17  8:56 PM  Result Value Ref Range Status   Specimen Description   Final    NECK Performed at [facility], [address], [place], [place] [address]    Special Requests   Final    NONE Performed at [facility], [address] [place], [place] [address]    Gram Stain   Final    FEW WBC PRESENT, PREDOMINANTLY PMN RARE GRAM VARIABLE ROD    Culture   Final    HOLDING FOR POSSIBLE ANAEROBE Performed at [facility], [address], [place], [place] [address]    Report Status PENDING  Incomplete  Urine Culture     Status: None   Collection Time: 04/03/17 12:30 PM  Result Value Ref Range Status   Specimen Description   Final    URINE, RANDOM Performed at [facility], [address] [place], [place] [address]    Special Requests   Final    Normal Performed at [facility], [address] [place], [place] [address]    Culture   Final    NO GROWTH Performed at [facility], [address], [place], [place] [address]    Report Status 04/04/2017 FINAL  Final    RADIOLOGY:  Ct Soft Tissue Neck W Contrast  Result Date: 04/02/2017 CLINICAL DATA:  Posterior left neck abscess. EXAM: CT NECK WITH CONTRAST TECHNIQUE: Multidetector CT imaging of the neck was performed using the standard protocol following the bolus administration of intravenous contrast. CONTRAST:  75mL ISOVUE-300 IOPAMIDOL (ISOVUE-300)  INJECTION 61% COMPARISON:  None. FINDINGS: Pharynx and larynx: No focal mucosal or submucosal lesions are present. Tonsils are within normal limits. The oropharynx, nasopharynx, and hypopharynx are unremarkable. The epiglottis is normal. Vocal cords are midline and symmetric. Salivary glands: The submandibular and parotid glands are within normal limits bilaterally. Thyroid: Normal Lymph nodes: Bilateral  reactive type cervical lymph nodes are present in the level 2, level 3, and level 4 stations, left greater than right. Vascular: Negative. Limited intracranial: Unremarkable. Visualized orbits: Within normal limits. Mastoids and visualized paranasal sinuses: The paranasal sinuses and mastoid air cells are clear. Skeleton: Vertebral body heights and alignment are normal. No focal lytic or blastic lesions are present. No definite disc disease is present. Upper chest: The lung apices are clear.  An azygos fissure is noted. Other: A complex peripherally enhancing fluid collection is present within the subcutaneous tissues of the left posterolateral neck. The area measures 6.5 x 4.4 x 2.2 cm. The collection extends to the deep cervical fascia and is inseparable from the left trapezius muscle. There is no definite extension beyond the deep cervical fascia. There is skin thickening and a smaller collection in the right posterolateral neck measuring 1.3 x 1.1 x 0.7 cm. IMPRESSION: 1. Complex subcutaneous peripherally enhancing fluid collection compatible with abscess in the left posterolateral neck measuring 6.5 x 4.4 x 2.2 cm extending to the deep cervical fascia without definite invasion into the deeper spaces. 2. The collection is inseparable from the superior aspect of the left trapezius muscle. 3. A smaller subcutaneous collection is also present in the right posterolateral neck measuring 1.1 cm, also concerning for infection. 4. Bilateral cervical adenopathy is likely reactive. 5. No focal inflammatory change or  abscess within the anterior neck. No discrete etiology. This appears to be related to a superficial cellulitis. Electronically Signed   By: Marin Robertshristopher  Mattern M.D.   On: 04/02/2017 18:15    EKG:  No orders found for this or any previous visit.    Management plans discussed with the patient, family and they are in agreement.  CODE STATUS:     Code Status Orders  (From admission, onward)        Start     Ordered   04/02/17 2241  Full code  Continuous     04/02/17 2240    Code Status History    Date Active Date Inactive Code Status Order ID Comments User Context   This patient has a current code status but no historical code status.      TOTAL TIME TAKING CARE OF THIS PATIENT: 43 minutes.   Note: This dictation was prepared with Dragon dictation along with smaller phrase technology. Any transcriptional errors that result from this process are unintentional.   @MEC @  on 04/05/2017 at 1:22 PM  Between 7am to 6pm - Pager - [phone]  After 6pm go to [email or web address] - password [email or web address] [facility]  [facility]  Office  [phone]  CC: Primary care physician; [place]err[place]n [place], [facility]

## 2017-04-05 NOTE — Progress Notes (Signed)
Discharge instructions reviewed with patient. Questions answered. Patient verbalized understanding. Prescriptions given to patient. IV removed with no complications.

## 2017-04-08 LAB — AEROBIC/ANAEROBIC CULTURE (SURGICAL/DEEP WOUND): CULTURE: NEGATIVE

## 2017-04-08 LAB — AEROBIC/ANAEROBIC CULTURE W GRAM STAIN (SURGICAL/DEEP WOUND)

## 2017-04-09 LAB — QUANTIFERON-TB GOLD PLUS: QUANTIFERON-TB GOLD PLUS: UNDETERMINED

## 2017-04-09 LAB — QUANTIFERON-TB GOLD PLUS (RQFGPL)
QUANTIFERON NIL VALUE: 0.02 [IU]/mL
QUANTIFERON TB1 AG VALUE: 0.02 [IU]/mL
QUANTIFERON TB2 AG VALUE: 0.02 [IU]/mL
QuantiFERON Mitogen Value: 0.35 IU/mL

## 2018-01-07 ENCOUNTER — Emergency Department
Admission: EM | Admit: 2018-01-07 | Discharge: 2018-01-07 | Disposition: A | Discharge status: Home / Self Care | Payer: Self-pay | Attending: Emergency Medicine | Admitting: Emergency Medicine

## 2018-01-07 ENCOUNTER — Other Ambulatory Visit: Payer: Self-pay

## 2018-01-07 ENCOUNTER — Encounter: Payer: Self-pay | Admitting: Emergency Medicine

## 2018-01-07 DIAGNOSIS — Z79899 Other long term (current) drug therapy: Secondary | ICD-10-CM | POA: Insufficient documentation

## 2018-01-07 DIAGNOSIS — L0291 Cutaneous abscess, unspecified: Secondary | ICD-10-CM

## 2018-01-07 DIAGNOSIS — L0201 Cutaneous abscess of face: Secondary | ICD-10-CM | POA: Insufficient documentation

## 2018-01-07 DIAGNOSIS — F17290 Nicotine dependence, other tobacco product, uncomplicated: Secondary | ICD-10-CM | POA: Insufficient documentation

## 2018-01-07 MED ORDER — HYDROMORPHONE HCL 1 MG/ML IJ SOLN
1.0000 mg | Freq: Once | INTRAMUSCULAR | Status: AC
  Administered 2018-01-07: 1 mg via INTRAVENOUS
  Filled 2018-01-07: qty 1

## 2018-01-07 MED ORDER — TRAMADOL HCL 50 MG PO TABS: 50.0000 mg | ORAL_TABLET | Freq: Two times a day (BID) | ORAL | 0 refills | Status: DC | PRN

## 2018-01-07 MED ORDER — CLINDAMYCIN HCL 300 MG PO CAPS: 300.0000 mg | ORAL_CAPSULE | Freq: Three times a day (TID) | ORAL | 0 refills | Status: AC

## 2018-01-07 MED ORDER — LIDOCAINE HCL (PF) 1 % IJ SOLN
5.0000 mL | Freq: Once | INTRAMUSCULAR | Status: AC
  Administered 2018-01-07: 5 mL
  Filled 2018-01-07: qty 5

## 2018-01-07 MED ORDER — LIDOCAINE HCL (PF) 1 % IJ SOLN
INTRAMUSCULAR | Status: AC
  Administered 2018-01-07: 5 mL
  Filled 2018-01-07: qty 5

## 2018-01-07 MED ORDER — IBUPROFEN 600 MG PO TABS: 600.0000 mg | ORAL_TABLET | Freq: Three times a day (TID) | ORAL | 0 refills | Status: DC | PRN

## 2018-01-07 NOTE — ED Provider Notes (Signed)
[facility] Emergency Department Provider Note   ____________________________________________   First MD Initiated Contact with Patient 01/07/18 1145     (approximate)  I have reviewed the triage vital signs and the nursing notes.   HISTORY  Chief Complaint Abscess    HPI Bryan Montes is a 22 y.o. male patient presents with abscess to the left lateral jaw.  Patient state has an increase in size of the last 2 days.  Patient had no relief with home supportive care.  Patient denies fever or drainage associated with this lesion.  Patient has history of recurrent staph infections.  Past Medical History:  Diagnosis Date  . Acute prostatitis     Patient Active Problem List   Diagnosis Date Noted  . Neck abscess 04/02/2017    Past Surgical History:  Procedure Laterality Date  . FRACTURE SURGERY      Prior to Admission medications   Medication Sig Start Date End Date Taking? Authorizing Provider  acetaminophen (TYLENOL) 325 MG tablet Take 2 tablets (650 mg total) by mouth every 6 (six) hours as needed for mild pain (or Fever >/= 101). 04/05/17   Ramonita Lab, MD  amoxicillin-clavulanate (AUGMENTIN) 875-125 MG tablet Take 1 tablet by mouth every 12 (twelve) hours. 04/05/17   Ramonita Lab, MD  clindamycin (CLEOCIN) 300 MG capsule Take 1 capsule (300 mg total) by mouth 3 (three) times daily for 10 days. 01/07/18 01/17/18  Joni Reining, PA-C  doxycycline (VIBRAMYCIN) 100 MG capsule Take 1 capsule (100 mg total) by mouth 2 (two) times daily. 04/05/17   Ramonita Lab, MD  gentamicin ointment (GARAMYCIN) 0.1 % Apply topically 3 (three) times daily. 04/05/17   Ramonita Lab, MD  ibuprofen (ADVIL,MOTRIN) 200 MG tablet Take 200-400 mg by mouth every 6 (six) hours as needed.    [provider]  ibuprofen (ADVIL,MOTRIN) 600 MG tablet Take 1 tablet (600 mg total) by mouth every 8 (eight) hours as needed. 01/07/18   Joni Reining, PA-C  Multiple  Vitamin (MULTIVITAMIN WITH MINERALS) TABS tablet Take 1 tablet by mouth daily.    [provider]  traMADol (ULTRAM) 50 MG tablet Take 1 tablet (50 mg total) by mouth every 12 (twelve) hours as needed. 01/07/18   Joni Reining, PA-C    Allergies Patient has no known allergies.  Family History  Problem Relation Age of Onset  . Breast cancer Maternal Grandmother     Social History Social History   Tobacco Use  . Smoking status: Current Every Day Smoker    Types: Cigars  . Smokeless tobacco: Never Used  Substance Use Topics  . Alcohol use: Yes    Comment: occ  . Drug use: No    Review of Systems Constitutional: No fever/chills Eyes: No visual changes. ENT: No sore throat. Cardiovascular: Denies chest pain. Respiratory: Denies shortness of breath. Gastrointestinal: No abdominal pain.  No nausea, no vomiting.  No diarrhea.  No constipation. Genitourinary: Negative for dysuria. Musculoskeletal: Negative for back pain. Skin: Negative for rash.  Facial abscess Neurological: Negative for headaches, focal weakness or numbness.   ____________________________________________   PHYSICAL EXAM:  VITAL SIGNS: ED Triage Vitals  Enc Vitals Group     BP 01/07/18 1133 126/64     Pulse Rate 01/07/18 1133 72     Resp 01/07/18 1133 18     Temp 01/07/18 1133 98.1 F (36.7 C)     Temp Source 01/07/18 1133 Oral     SpO2 01/07/18  1133 100 %     Weight 01/07/18 1132 123 lb (55.8 kg)     Height 01/07/18 1132 5\' 11"  (1.803 m)     Head Circumference --      Peak Flow --      Pain Score 01/07/18 1140 10     Pain Loc --      Pain Edu? --      Excl. in GC? --    Constitutional: Alert and oriented.  Anxious  eyes: Conjunctivae are normal. PERRL. EOMI. Head: Atraumatic. Nose: No congestion/rhinnorhea. Mouth/Throat: Mucous membranes are moist.  Oropharynx non-erythematous. Neck: No stridor.   Hematological/Lymphatic/Immunilogical: No cervical lymphadenopathy. Cardiovascular:  Normal rate, regular rhythm. Grossly normal heart sounds.  Good peripheral circulation. Respiratory: Normal respiratory effort.  No retractions. Lungs CTAB. Skin:  Skin is warm, dry and intact.  Bullous lesion left lateral inferior mandible.  Psychiatric: Mood and affect are normal. Speech and behavior are normal.  ____________________________________________   LABS (all labs ordered are listed, but only abnormal results are displayed)  Labs Reviewed - No data to display ____________________________________________  EKG   ____________________________________________  RADIOLOGY  ED MD interpretation:    Official radiology report(s): No results found.  ____________________________________________   PROCEDURES  Procedure(s) performed: None  .Marland KitchenIncision and Drainage Date/Time: 01/07/2018 1:36 PM Performed by: Joni Reining, PA-C Authorized by: Joni Reining, PA-C   Consent:    Consent given by:  Patient   Risks discussed:  Bleeding, incomplete drainage, pain and infection Location:    Type:  Abscess   Location:  Head   Head location:  Face Pre-procedure details:    Skin preparation:  Betadine Sedation:    Sedation type:  Anxiolysis Anesthesia (see MAR for exact dosages):    Anesthesia method:  Local infiltration   Local anesthetic:  Lidocaine 1% w/o epi Procedure type:    Complexity:  Simple Procedure details:    Needle aspiration: no     Incision types:  Single with marsupialization   Incision depth:  Dermal   Scalpel blade:  11   Wound management:  Probed and deloculated and irrigated with saline   Drainage amount:  Moderate   Wound treatment:  Drain placed   Packing materials:  1/2 in iodoform gauze   Amount 1/2" iodoform:  13cm Post-procedure details:    Patient tolerance of procedure:  Tolerated well, no immediate complications    Critical Care performed: No  ____________________________________________   INITIAL IMPRESSION / ASSESSMENT AND  PLAN / ED COURSE  As part of my medical decision making, I reviewed the following data within the electronic MEDICAL RECORD NUMBER    Facial abscess.  Discussed patient rationale for incised and drained at this time.  Patient given IV antibiotics with clindamycin.  Patient given Dilaudid IV.  See procedure note.  Patient advised return back in 2 days for wound check.      ____________________________________________   FINAL CLINICAL IMPRESSION(S) / ED DIAGNOSES  Final diagnoses:  Abscess     ED Discharge Orders         Ordered    clindamycin (CLEOCIN) 300 MG capsule  3 times daily     01/07/18 1333    traMADol (ULTRAM) 50 MG tablet  Every 12 hours PRN     01/07/18 1333    ibuprofen (ADVIL,MOTRIN) 600 MG tablet  Every 8 hours PRN     01/07/18 1333           Note:  This document was prepared  using Conservation officer, historic buildings and may include unintentional dictation errors.    Joni Reining, PA-C 01/07/18 1338    Emily Filbert, MD 01/07/18 1345

## 2018-01-07 NOTE — ED Notes (Signed)
See triage note  presents with a possible abscess area to left side of neck   States he has had the area for some time but it is usually moveable and painless over the past week it has become painful  No fever  Also has had some increased pain with swallowing

## 2018-01-07 NOTE — ED Triage Notes (Signed)
Pt reports that he has an abscess on the left side of his jaw. He has had these before. He has been using home remedies. He does not want blood work, and has taken tylenol. He just states that he wants some on to make an incision and let it drain

## 2018-01-07 NOTE — Discharge Instructions (Signed)
Follow discharge care instruction take medication as directed.  Return in 2 days for wound check.

## 2018-01-09 ENCOUNTER — Other Ambulatory Visit: Payer: Self-pay

## 2018-01-09 ENCOUNTER — Emergency Department
Admission: EM | Admit: 2018-01-09 | Discharge: 2018-01-09 | Disposition: A | Discharge status: Home / Self Care | Payer: Medicaid Other | Attending: Emergency Medicine | Admitting: Emergency Medicine

## 2018-01-09 DIAGNOSIS — Z79899 Other long term (current) drug therapy: Secondary | ICD-10-CM | POA: Insufficient documentation

## 2018-01-09 DIAGNOSIS — F17290 Nicotine dependence, other tobacco product, uncomplicated: Secondary | ICD-10-CM | POA: Insufficient documentation

## 2018-01-09 DIAGNOSIS — L0211 Cutaneous abscess of neck: Secondary | ICD-10-CM | POA: Insufficient documentation

## 2018-01-09 DIAGNOSIS — Z5189 Encounter for other specified aftercare: Secondary | ICD-10-CM

## 2018-01-09 NOTE — ED Notes (Signed)
See triage note  Presents tohave abscess area checked to left side of neck  Was seen 2 days ago and had area lanced

## 2018-01-09 NOTE — Discharge Instructions (Signed)
Follow-up with your primary care provider at [facility] primary if any continued problems.  Begin using warm moist compresses to the area frequently.  Continue taking your antibiotics until completely finished.  Return to the emergency department if any severe worsening of your symptoms or urgent concerns.

## 2018-01-09 NOTE — ED Provider Notes (Signed)
[facility] Emergency Department Provider Note  ____________________________________________   First MD Initiated Contact with Patient 01/09/18 1236     (approximate)  I have reviewed the triage vital signs and the nursing notes.   HISTORY  Chief Complaint Wound Check   HPI Bryan Montes Bryan Montes is a 22 y.o. male presents to the ED for packing removal.  Patient was here 2 days ago for an abscess that was drained on the left side of his neck.  Continues to take antibiotics without any difficulty.  He denies any fever or chills.   Past Medical History:  Diagnosis Date  . Acute prostatitis     Patient Active Problem List   Diagnosis Date Noted  . Neck abscess 04/02/2017    Past Surgical History:  Procedure Laterality Date  . FRACTURE SURGERY      Prior to Admission medications   Medication Sig Start Date End Date Taking? Authorizing Provider  acetaminophen (TYLENOL) 325 MG tablet Take 2 tablets (650 mg total) by mouth every 6 (six) hours as needed for mild pain (or Fever >/= 101). 04/05/17   Ramonita Lab, MD  clindamycin (CLEOCIN) 300 MG capsule Take 1 capsule (300 mg total) by mouth 3 (three) times daily for 10 days. 01/07/18 01/17/18  Joni Reining, PA-C  gentamicin ointment (GARAMYCIN) 0.1 % Apply topically 3 (three) times daily. 04/05/17   Ramonita Lab, MD  ibuprofen (ADVIL,MOTRIN) 200 MG tablet Take 200-400 mg by mouth every 6 (six) hours as needed.    [provider]  ibuprofen (ADVIL,MOTRIN) 600 MG tablet Take 1 tablet (600 mg total) by mouth every 8 (eight) hours as needed. 01/07/18   Joni Reining, PA-C  Multiple Vitamin (MULTIVITAMIN WITH MINERALS) TABS tablet Take 1 tablet by mouth daily.    [provider]    Allergies Patient has no known allergies.  Family History  Problem Relation Age of Onset  . Breast cancer Maternal Grandmother     Social History Social History   Tobacco Use  . Smoking status:  Current Every Day Smoker    Types: Cigars  . Smokeless tobacco: Never Used  Substance Use Topics  . Alcohol use: Yes    Comment: occ  . Drug use: No    Review of Systems Constitutional: No fever/chills Eyes: No visual changes. Cardiovascular: Denies chest pain. Skin: Positive for abscess. Neurological: Negative for headaches ____________________________________________   PHYSICAL EXAM:  VITAL SIGNS: ED Triage Vitals  Enc Vitals Group     BP 01/09/18 1215 123/65     Pulse Rate 01/09/18 1215 81     Resp 01/09/18 1215 18     Temp 01/09/18 1215 98.1 F (36.7 C)     Temp Source 01/09/18 1215 Oral     SpO2 01/09/18 1215 98 %     Weight 01/09/18 1216 123 lb (55.8 kg)     Height 01/09/18 1216 5\' 11"  (1.803 m)     Head Circumference --      Peak Flow --      Pain Score 01/09/18 1216 0     Pain Loc --      Pain Edu? --      Excl. in GC? --    Constitutional: Alert and oriented. Well appearing and in no acute distress. Eyes: Conjunctivae are normal. Head: Atraumatic. Neck: No stridor.   Cardiovascular: Normal rate, regular rhythm. Grossly normal heart sounds.  Good peripheral circulation. Respiratory: Normal respiratory effort.  No retractions. Lungs CTAB.  Musculoskeletal: His upper and lower extremities any difficulty.  Normal gait was noted. Neurologic:  Normal speech and language. No gross focal neurologic deficits are appreciated. No gait instability. Skin:  Skin is warm, dry.  Packing was removed from the abscess site on the left lateral neck.  There is some drainage present.  Area appears to be healing. Psychiatric: Mood and affect are normal. Speech and behavior are normal.  ____________________________________________   LABS (all labs ordered are listed, but only abnormal results are displayed)  Labs Reviewed - No data to display   PROCEDURES  Procedure(s) performed: None  Procedures  Critical Care performed:  No  ____________________________________________   INITIAL IMPRESSION / ASSESSMENT AND PLAN / ED COURSE  As part of my medical decision making, I reviewed the following data within the electronic MEDICAL RECORD NUMBER Notes from prior ED visits and [place] Controlled Substance Database  Packing was removed from the area and patient was given instructions on how to take care of this.  He is to continue taking antibiotics that were given to him 2 days ago.  He is to return to the emergency department over the weekend if any worsening of his symptoms.  We also discussed should he continue having problems that he would need a surgical consult.  Patient agrees and will comply.  ____________________________________________   FINAL CLINICAL IMPRESSION(S) / ED DIAGNOSES  Final diagnoses:  Wound check, abscess     ED Discharge Orders    None       Note:  This document was prepared using Dragon voice recognition software and may include unintentional dictation errors.    Tommi Rumps, PA-C 01/09/18 1503    Sharman Cheek, MD 01/09/18 [phone]

## 2018-01-09 NOTE — ED Triage Notes (Signed)
Says he needs packing removed from wound on left neck.

## 2018-01-09 NOTE — ED Triage Notes (Signed)
Pt comes via POV from home with instructions for wound recheck. Pt was seen here in ED on 01-07-18 for wound to side of left lower jaw area. Pt has bandage in place at this time. Pt states he has been taking the prescribed antibiotics.

## 2018-01-09 NOTE — ED Notes (Signed)
Pt ambulatory to POV without difficulty. VSS. NAD. Discharge instructions, RX and follow up reviewed. All questions and concerns addressed.  

## 2018-04-23 ENCOUNTER — Emergency Department
Admission: EM | Admit: 2018-04-23 | Discharge: 2018-04-24 | Disposition: A | Discharge status: Home / Self Care | Payer: Medicaid Other | Attending: Emergency Medicine | Admitting: Emergency Medicine

## 2018-04-23 ENCOUNTER — Other Ambulatory Visit: Payer: Self-pay

## 2018-04-23 DIAGNOSIS — Z79899 Other long term (current) drug therapy: Secondary | ICD-10-CM | POA: Insufficient documentation

## 2018-04-23 DIAGNOSIS — F10929 Alcohol use, unspecified with intoxication, unspecified: Secondary | ICD-10-CM | POA: Insufficient documentation

## 2018-04-23 DIAGNOSIS — R112 Nausea with vomiting, unspecified: Secondary | ICD-10-CM

## 2018-04-23 DIAGNOSIS — F17290 Nicotine dependence, other tobacco product, uncomplicated: Secondary | ICD-10-CM | POA: Insufficient documentation

## 2018-04-23 MED ORDER — ONDANSETRON 4 MG PO TBDP: 4.0000 mg | ORAL_TABLET | Freq: Three times a day (TID) | ORAL | 0 refills | Status: DC | PRN

## 2018-04-23 MED ORDER — HALOPERIDOL LACTATE 5 MG/ML IJ SOLN: 5.0000 mg | Freq: Once | INTRAMUSCULAR | Status: DC

## 2018-04-23 MED ORDER — SODIUM CHLORIDE 0.9 % IV SOLN: 8.0000 mg | Freq: Once | INTRAVENOUS | Status: DC

## 2018-04-23 NOTE — ED Triage Notes (Signed)
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 Here for vomiting, denies any drug use.

## 2018-04-23 NOTE — ED Notes (Signed)
Pt. Asked "Why are you here"  "Because I could not drive here myself".  Pt. Asked "How did you get here?".  Pt. States my friend brought me here".  Pt. States "I have been congested for a month".  Pt. States drinking alcohol today.

## 2018-04-23 NOTE — Discharge Instructions (Signed)
Tomorrow you're going to feel awful.  Please use ibuprofen as needed for headache and zofran as needed for nausea.  Never drink that much ever again.  Return to the ED for any concerns.  It was a pleasure to take care of you today, and thank you for coming to our emergency department.  If you have any questions or concerns before leaving please ask the nurse to grab me and I'm more than happy to go through your aftercare instructions again.  If you have any concerns once you are home that you are not improving or are in fact getting worse before you can make it to your follow-up appointment, please do not hesitate to call [phone] and come back for further evaluation.  Merrily Brittle, MD

## 2018-04-23 NOTE — ED Provider Notes (Signed)
[facility] Emergency Department Provider Note  ____________________________________________   First MD Initiated Contact with Patient 04/23/18 2331     (approximate)  I have reviewed the triage vital signs and the nursing notes.   HISTORY  Chief Complaint Alcohol Intoxication  Level 5 exemption history obtained largely from the patient's sober friend at bedside  HPI Bryan SoursJesse Cordell Macky LowerMuro Montes is a 23 y.o. male who comes to the emergency department with alcohol intoxication nausea and vomiting.  According to the patient's sober friend the patient showed up at the friends work heavily intoxicated today and was slurring his speech.  The friend's boss told a friend to take the patient home.  When they got home the patient said that he was hungry and if the friend would not drive him to get food the patient himself would drive himself intoxicated.  The friend then drove the patient to get food and the patient began to vomit and requested to come to the emergency department.    Past Medical History:  Diagnosis Date  . Acute prostatitis     Patient Active Problem List   Diagnosis Date Noted  . Neck abscess 04/02/2017    Past Surgical History:  Procedure Laterality Date  . FRACTURE SURGERY      Prior to Admission medications   Medication Sig Start Date End Date Taking? Authorizing Provider  acetaminophen (TYLENOL) 325 MG tablet Take 2 tablets (650 mg total) by mouth every 6 (six) hours as needed for mild pain (or Fever >/= 101). 04/05/17   Ramonita LabGouru, Aruna, MD  gentamicin ointment (GARAMYCIN) 0.1 % Apply topically 3 (three) times daily. 04/05/17   Ramonita LabGouru, Aruna, MD  ibuprofen (ADVIL,MOTRIN) 200 MG tablet Take 200-400 mg by mouth every 6 (six) hours as needed.    [provider]  ibuprofen (ADVIL,MOTRIN) 600 MG tablet Take 1 tablet (600 mg total) by mouth every 8 (eight) hours as needed. 01/07/18   Joni ReiningSmith, Ronald K, PA-C  Multiple Vitamin (MULTIVITAMIN  WITH MINERALS) TABS tablet Take 1 tablet by mouth daily.    [provider]  ondansetron (ZOFRAN ODT) 4 MG disintegrating tablet Take 1 tablet (4 mg total) by mouth every 8 (eight) hours as needed for nausea or vomiting. 04/23/18   [name], Lurleen Soltero, MD    Allergies Patient has no known allergies.  Family History  Problem Relation Age of Onset  . Breast cancer Maternal Grandmother     Social History Social History   Tobacco Use  . Smoking status: Current Every Day Smoker    Types: Cigars  . Smokeless tobacco: Never Used  Substance Use Topics  . Alcohol use: Yes    Comment: occ  . Drug use: No    Review of Systems Level 5 exemption history limited by the patient's intoxication  ____________________________________________   PHYSICAL EXAM:  VITAL SIGNS: ED Triage Vitals  Enc Vitals Group     BP 04/23/18 2330 117/62     Pulse Rate 04/23/18 2330 (!) 107     Resp 04/23/18 2330 20     Temp --      Temp src --      SpO2 04/23/18 2330 100 %     Weight 04/23/18 2329 130 lb (59 kg)     Height 04/23/18 2329 5\' 11"  (1.803 m)     Head Circumference --      Peak Flow --      Pain Score 04/23/18 2329 10     Pain Loc --  Pain Edu? --      Excl. in GC? --     Constitutional: Appears heavily intoxicated with vomiting and spitting onto the floor although no retching Eyes: PERRL EOMI. midrange and brisk Head: Atraumatic.   Nose: No congestion/rhinnorhea. Mouth/Throat: No trismus Neck: No stridor.   No meningismus Cardiovascular: Normal rate, regular rhythm. Grossly normal heart sounds.  Good peripheral circulation.  Chest wall stable no crepitus Respiratory: Decreased respiratory effort.  No retractions. Lungs CTAB and moving good air Gastrointestinal: Soft nontender Musculoskeletal: No lower extremity edema   Neurologic:   No gross focal neurologic deficits are appreciated. Skin:  Skin is warm, dry and intact. No rash noted. Psychiatric: Heavily  intoxicated    ____________________________________________   DIFFERENTIAL includes but not limited to  Alcohol intoxication, dehydration, pancreatitis, Boerhaave syndrome ____________________________________________   LABS (all labs ordered are listed, but only abnormal results are displayed)  Labs Reviewed - No data to display   __________________________________________  EKG   ____________________________________________  RADIOLOGY   ____________________________________________   PROCEDURES  Procedure(s) performed: no  Procedures  Critical Care performed: no  ____________________________________________   INITIAL IMPRESSION / ASSESSMENT AND PLAN / ED COURSE  Pertinent labs & imaging results that were available during my care of the patient were reviewed by me and considered in my medical decision making (see chart for details).   As part of my medical decision making, I reviewed the following data within the electronic MEDICAL RECORD NUMBER History obtained from family if available, nursing notes, old chart and ekg, as well as notes from prior ED visits.  The patient is heavily intoxicated nauseated and vomiting and I attempted to give him intramuscular Zofran however he declined stating he felt "fine".  We were able to get him a sober ride home and he is discharged home with Zofran in stable condition.  No indication for involuntary commitment nor labs.      ____________________________________________   FINAL CLINICAL IMPRESSION(S) / ED DIAGNOSES  Final diagnoses:  Alcoholic intoxication with complication (HCC)  Non-intractable vomiting with nausea, unspecified vomiting type      NEW MEDICATIONS STARTED DURING THIS VISIT:  Discharge Medication List as of 04/23/2018 11:36 PM    START taking these medications   Details  ondansetron (ZOFRAN ODT) 4 MG disintegrating tablet Take 1 tablet (4 mg total) by mouth every 8 (eight) hours as needed for nausea  or vomiting., Starting Wed 04/23/2018, Print         Note:  This document was prepared using Dragon voice recognition software and may include unintentional dictation errors.    [name], Jaxn Chiquito, MD 04/27/18 [phone]

## 2018-04-23 NOTE — ED Notes (Signed)
Disposition charted in error

## 2018-04-24 MED ORDER — ONDANSETRON HCL 4 MG/2ML IJ SOLN
4.0000 mg | Freq: Once | INTRAMUSCULAR | Status: DC
  Filled 2018-04-24: qty 2

## 2018-04-24 NOTE — ED Notes (Signed)
Patients mother called stated she could pick him up when [phone]

## 2018-04-24 NOTE — ED Notes (Signed)
Patients mother called, she said she lives about an half an hour away, but will pick up patient.

## 2018-04-24 NOTE — ED Notes (Signed)
Patients mother in waiting room.

## 2018-09-24 ENCOUNTER — Emergency Department: Payer: Self-pay

## 2018-09-24 ENCOUNTER — Other Ambulatory Visit: Payer: Self-pay

## 2018-09-24 ENCOUNTER — Inpatient Hospital Stay
Admission: EM | Admit: 2018-09-24 | Discharge: 2018-09-24 | DRG: 603 | Disposition: A | Discharge status: Home / Self Care | Payer: Self-pay | Attending: Internal Medicine | Admitting: Internal Medicine

## 2018-09-24 ENCOUNTER — Inpatient Hospital Stay
Admission: EM | Admit: 2018-09-24 | Discharge: 2018-09-26 | DRG: 603 | Disposition: A | Discharge status: Home / Self Care | Payer: Medicaid Other | Attending: Internal Medicine | Admitting: Internal Medicine

## 2018-09-24 ENCOUNTER — Encounter: Payer: Self-pay | Admitting: Emergency Medicine

## 2018-09-24 DIAGNOSIS — L0211 Cutaneous abscess of neck: Principal | ICD-10-CM | POA: Diagnosis present

## 2018-09-24 DIAGNOSIS — L700 Acne vulgaris: Secondary | ICD-10-CM | POA: Diagnosis present

## 2018-09-24 DIAGNOSIS — L03221 Cellulitis of neck: Secondary | ICD-10-CM | POA: Diagnosis present

## 2018-09-24 DIAGNOSIS — Z1159 Encounter for screening for other viral diseases: Secondary | ICD-10-CM

## 2018-09-24 DIAGNOSIS — L905 Scar conditions and fibrosis of skin: Secondary | ICD-10-CM

## 2018-09-24 DIAGNOSIS — F17290 Nicotine dependence, other tobacco product, uncomplicated: Secondary | ICD-10-CM | POA: Diagnosis present

## 2018-09-24 DIAGNOSIS — L0291 Cutaneous abscess, unspecified: Secondary | ICD-10-CM

## 2018-09-24 DIAGNOSIS — Z803 Family history of malignant neoplasm of breast: Secondary | ICD-10-CM

## 2018-09-24 DIAGNOSIS — L0390 Cellulitis, unspecified: Secondary | ICD-10-CM | POA: Diagnosis present

## 2018-09-24 LAB — CBC WITH DIFFERENTIAL/PLATELET
Abs Immature Granulocytes: 0.1 10*3/uL — ABNORMAL HIGH (ref 0.00–0.07)
Basophils Absolute: 0.1 10*3/uL (ref 0.0–0.1)
Basophils Relative: 1 %
Eosinophils Absolute: 0.6 10*3/uL — ABNORMAL HIGH (ref 0.0–0.5)
Eosinophils Relative: 4 %
HCT: 44.2 % (ref 39.0–52.0)
Hemoglobin: 14.2 g/dL (ref 13.0–17.0)
Immature Granulocytes: 1 %
Lymphocytes Relative: 15 %
Lymphs Abs: 2.2 10*3/uL (ref 0.7–4.0)
MCH: 27.5 pg (ref 26.0–34.0)
MCHC: 32.1 g/dL (ref 30.0–36.0)
MCV: 85.7 fL (ref 80.0–100.0)
Monocytes Absolute: 0.9 10*3/uL (ref 0.1–1.0)
Monocytes Relative: 6 %
Neutro Abs: 11.2 10*3/uL — ABNORMAL HIGH (ref 1.7–7.7)
Neutrophils Relative %: 73 %
Platelets: 258 10*3/uL (ref 150–400)
RBC: 5.16 MIL/uL (ref 4.22–5.81)
RDW: 13.3 % (ref 11.5–15.5)
WBC: 15 10*3/uL — ABNORMAL HIGH (ref 4.0–10.5)
nRBC: 0 % (ref 0.0–0.2)

## 2018-09-24 LAB — COMPREHENSIVE METABOLIC PANEL
ALT: 35 U/L (ref 0–44)
AST: 39 U/L (ref 15–41)
Albumin: 4.1 g/dL (ref 3.5–5.0)
Alkaline Phosphatase: 85 U/L (ref 38–126)
Anion gap: 8 (ref 5–15)
BUN: 11 mg/dL (ref 6–20)
CO2: 25 mmol/L (ref 22–32)
Calcium: 8.5 mg/dL — ABNORMAL LOW (ref 8.9–10.3)
Chloride: 102 mmol/L (ref 98–111)
Creatinine, Ser: 0.77 mg/dL (ref 0.61–1.24)
GFR calc Af Amer: >60 mL/min (ref >60)
GFR calc non Af Amer: >60 mL/min (ref >60)
Glucose, Bld: 94 mg/dL (ref 70–99)
Potassium: 3.8 mmol/L (ref 3.5–5.1)
Sodium: 135 mmol/L (ref 135–145)
Total Bilirubin: 0.5 mg/dL (ref 0.3–1.2)
Total Protein: 7.8 g/dL (ref 6.5–8.1)

## 2018-09-24 LAB — URINE DRUG SCREEN, QUALITATIVE (ARMC ONLY)
Amphetamines, Ur Screen: NOT DETECTED
Barbiturates, Ur Screen: NOT DETECTED
Benzodiazepine, Ur Scrn: NOT DETECTED
Cannabinoid 50 Ng, Ur ~~LOC~~: POSITIVE — AB
Cocaine Metabolite,Ur ~~LOC~~: NOT DETECTED
MDMA (Ecstasy)Ur Screen: NOT DETECTED
Methadone Scn, Ur: NOT DETECTED
Opiate, Ur Screen: NOT DETECTED
Phencyclidine (PCP) Ur S: NOT DETECTED
Tricyclic, Ur Screen: NOT DETECTED

## 2018-09-24 LAB — LACTIC ACID, PLASMA: Lactic Acid, Venous: 1.5 mmol/L (ref 0.5–1.9)

## 2018-09-24 LAB — SARS CORONAVIRUS 2 BY RT PCR (HOSPITAL ORDER, PERFORMED IN ~~LOC~~ HOSPITAL LAB): SARS Coronavirus 2: NEGATIVE

## 2018-09-24 MED ORDER — ACETAMINOPHEN 325 MG PO TABS: 650.0000 mg | ORAL_TABLET | Freq: Four times a day (QID) | ORAL | Status: DC | PRN

## 2018-09-24 MED ORDER — FENTANYL CITRATE (PF) 100 MCG/2ML IJ SOLN
50.0000 ug | Freq: Once | INTRAMUSCULAR | Status: AC
  Administered 2018-09-24: 50 ug via INTRAVENOUS
  Filled 2018-09-24: qty 2

## 2018-09-24 MED ORDER — ONDANSETRON HCL 4 MG PO TABS: 4.0000 mg | ORAL_TABLET | Freq: Four times a day (QID) | ORAL | Status: DC | PRN

## 2018-09-24 MED ORDER — CLINDAMYCIN PHOSPHATE 600 MG/50ML IV SOLN: 600.0000 mg | Freq: Three times a day (TID) | INTRAVENOUS | Status: DC

## 2018-09-24 MED ORDER — OXYCODONE HCL 5 MG PO TABS
5.0000 mg | ORAL_TABLET | ORAL | Status: DC | PRN
  Administered 2018-09-25 – 2018-09-26 (×3): 5 mg via ORAL
  Filled 2018-09-24 (×3): qty 1

## 2018-09-24 MED ORDER — VANCOMYCIN HCL 1.5 G IV SOLR
1500.0000 mg | Freq: Once | INTRAVENOUS | Status: AC
  Administered 2018-09-25: 1500 mg via INTRAVENOUS
  Filled 2018-09-24: qty 1500

## 2018-09-24 MED ORDER — HYDROCODONE-ACETAMINOPHEN 5-325 MG PO TABS: 1.0000 | ORAL_TABLET | ORAL | Status: DC | PRN

## 2018-09-24 MED ORDER — ONDANSETRON HCL 4 MG PO TABS
4.0000 mg | ORAL_TABLET | Freq: Four times a day (QID) | ORAL | Status: DC | PRN
  Filled 2018-09-24: qty 1

## 2018-09-24 MED ORDER — ACETAMINOPHEN 650 MG RE SUPP
650.0000 mg | Freq: Four times a day (QID) | RECTAL | Status: DC | PRN
  Filled 2018-09-24: qty 1

## 2018-09-24 MED ORDER — ENOXAPARIN SODIUM 40 MG/0.4ML ~~LOC~~ SOLN
40.0000 mg | SUBCUTANEOUS | Status: DC
  Filled 2018-09-24: qty 0.4

## 2018-09-24 MED ORDER — SODIUM CHLORIDE 0.9 % IV SOLN
INTRAVENOUS | Status: DC
  Administered 2018-09-24: 14:00:00 via INTRAVENOUS

## 2018-09-24 MED ORDER — CLINDAMYCIN PHOSPHATE 600 MG/50ML IV SOLN
600.0000 mg | Freq: Once | INTRAVENOUS | Status: AC
  Administered 2018-09-24: 600 mg via INTRAVENOUS
  Filled 2018-09-24: qty 50

## 2018-09-24 MED ORDER — DOCUSATE SODIUM 100 MG PO CAPS: 100.0000 mg | ORAL_CAPSULE | Freq: Two times a day (BID) | ORAL | Status: DC

## 2018-09-24 MED ORDER — BISACODYL 5 MG PO TBEC
5.0000 mg | DELAYED_RELEASE_TABLET | Freq: Every day | ORAL | Status: DC | PRN
  Filled 2018-09-24: qty 1

## 2018-09-24 MED ORDER — LIDOCAINE-EPINEPHRINE 2 %-1:100000 IJ SOLN
20.0000 mL | Freq: Once | INTRAMUSCULAR | Status: AC
  Administered 2018-09-24: 10:00:00 20 mL

## 2018-09-24 MED ORDER — ACETAMINOPHEN 650 MG RE SUPP: 650.0000 mg | Freq: Four times a day (QID) | RECTAL | Status: DC | PRN

## 2018-09-24 MED ORDER — CEFAZOLIN SODIUM-DEXTROSE 1-4 GM/50ML-% IV SOLN
1.0000 g | Freq: Three times a day (TID) | INTRAVENOUS | Status: DC
  Administered 2018-09-24: 1 g via INTRAVENOUS
  Filled 2018-09-24 (×4): qty 50

## 2018-09-24 MED ORDER — DOXYCYCLINE HYCLATE 50 MG PO CAPS: 100.0000 mg | ORAL_CAPSULE | Freq: Two times a day (BID) | ORAL | 0 refills | Status: DC

## 2018-09-24 MED ORDER — ENOXAPARIN SODIUM 40 MG/0.4ML ~~LOC~~ SOLN: 40.0000 mg | SUBCUTANEOUS | Status: DC

## 2018-09-24 MED ORDER — SODIUM CHLORIDE 0.9 % IV SOLN
1.0000 g | Freq: Three times a day (TID) | INTRAVENOUS | Status: DC
  Administered 2018-09-25 (×2): 1 g via INTRAVENOUS
  Filled 2018-09-24 (×4): qty 1

## 2018-09-24 MED ORDER — ONDANSETRON HCL 4 MG/2ML IJ SOLN: 4.0000 mg | Freq: Four times a day (QID) | INTRAMUSCULAR | Status: DC | PRN

## 2018-09-24 MED ORDER — CLINDAMYCIN PHOSPHATE 600 MG/50ML IV SOLN
600.0000 mg | Freq: Once | INTRAVENOUS | Status: AC
  Administered 2018-09-24: 600 mg via INTRAVENOUS
  Filled 2018-09-24 (×2): qty 50

## 2018-09-24 MED ORDER — IOHEXOL 350 MG/ML SOLN
75.0000 mL | Freq: Once | INTRAVENOUS | Status: AC | PRN
  Administered 2018-09-24: 75 mL via INTRAVENOUS

## 2018-09-24 MED ORDER — CLINDAMYCIN HCL 300 MG PO CAPS: 300.0000 mg | ORAL_CAPSULE | Freq: Three times a day (TID) | ORAL | 0 refills | Status: DC

## 2018-09-24 NOTE — ED Notes (Signed)
MD paged, pt states he wants to leave AMA.

## 2018-09-24 NOTE — ED Notes (Signed)
Introduced self to patient. Pt laying in bed in NAD. Updated on wait time for doctor.

## 2018-09-24 NOTE — H&P (Signed)
[facility] - [place] at [facility]   PATIENT NAME: Bryan Montes    MR#:  [id number]  DATE OF BIRTH:  08/08/1995  DATE OF ADMISSION:  09/24/2018  PRIMARY CARE PHYSICIAN: Jerrilyn CairoMeb[place], [facility]   REQUESTING/REFERRING PHYSICIAN: Cyril LoosenKinner, MD  CHIEF COMPLAINT:  Neck abscess  HISTORY OF PRESENT ILLNESS:  Bryan Montes  is a 23 y.o. male who presents with chief complaint as above.  Patient came to the ED earlier today due to an abscess on his neck.  He had it drained and it was recommended that he stay for IV antibiotics.  Patient felt that he was doing okay and left AMA.  He returned to the ED again tonight feeling like his lesion was getting worse.  Hospitalist were called for admission for IV antibiotics  PAST MEDICAL HISTORY:   Past Medical History:  Diagnosis Date  . Acute prostatitis      PAST SURGICAL HISTORY:   Past Surgical History:  Procedure Laterality Date  . FRACTURE SURGERY       SOCIAL HISTORY:   Social History   Tobacco Use  . Smoking status: Current Every Day Smoker    Types: Cigars  . Smokeless tobacco: Never Used  Substance Use Topics  . Alcohol use: Yes    Comment: occ     FAMILY HISTORY:   Family History  Problem Relation Age of Onset  . Breast cancer Maternal Grandmother      DRUG ALLERGIES:  No Known Allergies  MEDICATIONS AT HOME:   Prior to Admission medications   Medication Sig Start Date End Date Taking? Authorizing Provider  doxycycline (VIBRAMYCIN) 50 MG capsule Take 2 capsules (100 mg total) by mouth 2 (two) times daily. Take doxycycline 100 mg p.o. twice daily for 1 week, patient to follow-up on cultures that are done with abscess drainage from the left neck. 09/24/18   Katha HammingKonidena, Snehalatha, MD    REVIEW OF SYSTEMS:  Review of Systems  Constitutional: Negative for chills, fever, malaise/fatigue and weight loss.  HENT: Negative for ear pain, hearing loss and tinnitus.   Eyes:  Negative for blurred vision, double vision, pain and redness.  Respiratory: Negative for cough, hemoptysis and shortness of breath.   Cardiovascular: Negative for chest pain, palpitations, orthopnea and leg swelling.  Gastrointestinal: Negative for abdominal pain, constipation, diarrhea, nausea and vomiting.  Genitourinary: Negative for dysuria, frequency and hematuria.  Musculoskeletal: Negative for back pain, joint pain and neck pain.  Skin:       Left neck abscess and surrounding erythema  Neurological: Negative for dizziness, tremors, focal weakness and weakness.  Endo/Heme/Allergies: Negative for polydipsia. Does not bruise/bleed easily.  Psychiatric/Behavioral: Negative for depression. The patient is not nervous/anxious and does not have insomnia.      VITAL SIGNS:   Vitals:   09/24/18 1958 09/24/18 2006 09/24/18 2203  BP: 129/72  119/70  Pulse: 100  81  Resp: 18  20  Temp: 98.5 F (36.9 C)    TempSrc: Oral    SpO2: 97%  98%  Weight: 65.8 kg 65.8 kg   Height: 5\' 9"  (1.753 m) 5\' 9"  (1.753 m)    Wt Readings from Last 3 Encounters:  09/24/18 65.8 kg  09/24/18 65.8 kg  04/23/18 59 kg    PHYSICAL EXAMINATION:  Physical Exam  Vitals reviewed. Constitutional: He is oriented to person, place, and time. He appears well-developed and well-nourished. No distress.  HENT:  Head: Normocephalic and atraumatic.  Mouth/Throat: Oropharynx is  clear and moist.  Eyes: Pupils are equal, round, and reactive to light. Conjunctivae and EOM are normal. No scleral icterus.  Neck: Normal range of motion. Neck supple. No JVD present. No thyromegaly present.  Cardiovascular: Normal rate, regular rhythm and intact distal pulses. Exam reveals no gallop and no friction rub.  No murmur heard. Respiratory: Effort normal and breath sounds normal. No respiratory distress. He has no wheezes. He has no rales.  GI: Soft. Bowel sounds are normal. He exhibits no distension. There is no abdominal tenderness.   Musculoskeletal: Normal range of motion.        General: No edema.     Comments: No arthritis, no gout  Lymphadenopathy:    He has no cervical adenopathy.  Neurological: He is alert and oriented to person, place, and time. No cranial nerve deficit.  No dysarthria, no aphasia  Skin: Skin is warm and dry. No rash noted. There is erythema (Of the left neck surrounding his abscess, which is s/p I&D and is draining).  Psychiatric: He has a normal mood and affect. His behavior is normal. Judgment and thought content normal.    LABORATORY PANEL:   CBC Recent Labs  Lab 09/24/18 0633  WBC 15.0*  HGB 14.2  HCT 44.2  PLT 258   ------------------------------------------------------------------------------------------------------------------  Chemistries  Recent Labs  Lab 09/24/18 0633  NA 135  K 3.8  CL 102  CO2 25  GLUCOSE 94  BUN 11  CREATININE 0.77  CALCIUM 8.5*  AST 39  ALT 35  ALKPHOS 85  BILITOT 0.5   ------------------------------------------------------------------------------------------------------------------  Cardiac Enzymes No results for input(s): TROPONINI in the last 168 hours. 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 EXAM: CT NECK WITH CONTRAST TECHNIQUE: Multidetector CT imaging of the neck was performed using the standard protocol following the bolus administration of intravenous contrast. CONTRAST:  103mL OMNIPAQUE IOHEXOL 350 MG/ML SOLN COMPARISON:  04/02/2017 FINDINGS: Pharynx and larynx: Mild motion artifact at the larynx. Mild tonsillar hypertrophy appears physiologic. Laryngeal and pharyngeal soft tissue contours are within normal limits. Negative parapharyngeal and retropharyngeal spaces. Salivary glands: Sublingual space, submandibular  glands, and parotid glands are within normal limits. Thyroid: Negative. Lymph nodes: Along the left posterior neck in the same area which was affected in 2019 there is an irregular rim enhancing multilocular fluid collection encompassing 35 by 20 x 53 millimeters (AP by transverse by CC) with a combination of simple and complex internal fluid. There is a small nearby satellite lesion located slightly posterior and inferior to the dominant collection (series 2, image 50 and coronal image 84). As before, the lesion involves both the subcutaneous layer air and the left trapezius and posterior margin of the left sternocleidomastoid muscles. There is regional cellulitis. No soft tissue gas. Similar reactive appearing left level 2 and level 3 lymphadenopathy. Level 5 is relatively spared. Affected nodes measure up to 10 millimeters short axis. No cystic or necrotic nodes are identified. There is a small residual 9 millimeter oval fluid collection in the contralateral right neck corresponding to the area of a rim enhancing collection in 2019 (series 2, image 44). This does not have an active appearance. Level 1 lymph nodes are within normal limits but right side level 2 lymph nodes are also prominent up to 12-13 millimeter short axis, stable since 2019. Vascular: The major vascular structures in the neck including the left external and internal jugular  veins are patent. Major vascular structures at the skull base appear patent. Limited intracranial: Negative. Visualized orbits: Negative. Mastoids and visualized paranasal sinuses: Clear. Skeleton: Negative. No osseous abnormality identified. Upper chest: Negative lung apices, azygos fissure (normal variant). Negative visible superior mediastinum. IMPRESSION: 1. 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  Associated regional cellulitis. No soft tissue gas. 2. Reactive appearing cervical lymphadenopathy. No cystic or necrotic nodes. 3. Small remnant of the small contralateral 2019 right neck abscess (series 2, image 44). Electronically Signed   By: Odessa FlemingH  Hall M.D.   On: 09/24/2018 09:21    EKG:  No orders found for this or any previous visit.  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  When he had this back in January it grew Propionibacterium and finegoldia.  Patient states that in the past when he is taking clindamycin he feels like it has not really worked for him.  IV antibiotics ordered in the ED.  Patient states he was tested for MRSA before and it was negative.  We will repeat MRSA screen, and if negative can likely DC vancomycin.  PRN analgesia Active Problems:   Cellulitis -IV antibiotics as above  Chart review performed and case discussed with ED provider. Labs, imaging and/or ECG reviewed by provider and discussed with patient/family. Management plans discussed with the patient and/or family.  COVID-19 status: Tested negative     DVT PROPHYLAXIS: SubQ lovenox   GI PROPHYLAXIS:  None  ADMISSION STATUS: Observation  CODE STATUS: Full Code Status History    Date Active Date Inactive Code Status Order ID Comments User Context   09/24/2018 1149 09/24/2018 1950 Full Code [id number]  Katha HammingKonidena, Snehalatha, MD ED   04/02/2017 2240 04/05/2017 1814 Full Code [id number]  Oralia ManisWillis, Dequavion Follette, MD ED   Advance Care Planning Activity      TOTAL TIME TAKING CARE OF THIS PATIENT: 40 minutes.   This patient was evaluated in the context of the global COVID-19 pandemic, which necessitated consideration that the patient might be at risk for infection with the SARS-CoV-2 virus that causes COVID-19. Institutional protocols and algorithms that pertain to the evaluation of patients at risk for COVID-19 are in a state of rapid change based on information released by  regulatory bodies including the CDC and federal and state organizations. These policies and algorithms were followed to the best of this provider's knowledge to date during the patient's care at this facility.  Bryan Montes 09/24/2018, 10:18 PM  [facility]  Office  [phone]  CC: Primary care physician; Jerrilyn [place], [facility]  Note:  This document was prepared using Dragon voice recognition software and may include unintentional dictation errors.

## 2018-09-24 NOTE — ED Provider Notes (Addendum)
[facility] Emergency Department Provider Note  ____________________________________________   I have reviewed the triage vital signs and the nursing notes. Where available I have reviewed prior notes and, if possible and indicated, outside hospital notes.    HISTORY  Chief Complaint Abscess    HPI Bryan Montes is a 23 y.o. male with a history of recurrent abscesses, and EtOH abuse.  Denies IV drug abuse, states that he has an abscess again in the left neck where he has had them before.  Patient denies fever, he states the pain is worse when he changes position when he touches his neck or when he swallows.  He does not feel however that he is having trouble breathing.  He states that he has had no other symptoms.  It is been going on for 8 days he was hoping to get better on its own as it sometimes does.  He does have a history of multiple recurrent abscesses and has had to have one in this exact site drained last year with a drain placed by ENT.   Past Medical History:  Diagnosis Date  . Acute prostatitis     Patient Active Problem List   Diagnosis Date Noted  . Neck abscess 04/02/2017    Past Surgical History:  Procedure Laterality Date  . FRACTURE SURGERY      Prior to Admission medications   Medication Sig Start Date End Date Taking? Authorizing Provider  acetaminophen (TYLENOL) 325 MG tablet Take 2 tablets (650 mg total) by mouth every 6 (six) hours as needed for mild pain (or Fever >/= 101). 04/05/17   Ramonita LabGouru, Aruna, MD  gentamicin ointment (GARAMYCIN) 0.1 % Apply topically 3 (three) times daily. 04/05/17   Ramonita LabGouru, Aruna, MD  ibuprofen (ADVIL,MOTRIN) 200 MG tablet Take 200-400 mg by mouth every 6 (six) hours as needed.    [provider]  ibuprofen (ADVIL,MOTRIN) 600 MG tablet Take 1 tablet (600 mg total) by mouth every 8 (eight) hours as needed. 01/07/18   Joni ReiningSmith, Ronald K, PA-C  Multiple Vitamin (MULTIVITAMIN WITH MINERALS) TABS  tablet Take 1 tablet by mouth daily.    [provider]  ondansetron (ZOFRAN ODT) 4 MG disintegrating tablet Take 1 tablet (4 mg total) by mouth every 8 (eight) hours as needed for nausea or vomiting. 04/23/18   [name], Neil, MD    Allergies Patient has no known allergies.  Family History  Problem Relation Age of Onset  . Breast cancer Maternal Grandmother     Social History Social History   Tobacco Use  . Smoking status: Current Every Day Smoker    Types: Cigars  . Smokeless tobacco: Never Used  Substance Use Topics  . Alcohol use: Yes    Comment: occ  . Drug use: No    Review of Systems Constitutional: No fever/chills Eyes: No visual changes. ENT: No sore throat. No stiff neck no neck pain Cardiovascular: Denies chest pain. Respiratory: Denies shortness of breath. Gastrointestinal:   no vomiting.  No diarrhea.  No constipation. Genitourinary: Negative for dysuria. Musculoskeletal: Negative lower extremity swelling Skin: Negative for rash. Neurological: Negative for severe headaches, focal weakness or numbness.   ____________________________________________   PHYSICAL EXAM:  VITAL SIGNS: ED Triage Vitals  Enc Vitals Group     BP 09/24/18 0636 115/67     Pulse Rate 09/24/18 0636 (!) 105     Resp 09/24/18 0636 18     Temp 09/24/18 0636 98.8 F (37.1 C)  Temp Source 09/24/18 0636 Oral     SpO2 09/24/18 0636 100 %     Weight 09/24/18 0627 145 lb (65.8 kg)     Height 09/24/18 0627 5\' 9"  (1.753 m)     Head Circumference --      Peak Flow --      Pain Score 09/24/18 0627 9     Pain Loc --      Pain Edu? --      Excl. in GC? --     Constitutional: Alert and oriented. Well appearing and in no acute distress. Eyes: Conjunctivae are normal Head: Atraumatic HEENT: No congestion/rhinnorhea. Mucous membranes are moist.  Oropharynx non-erythematous Neck: There is an area of erythema and fullness to the left posterior neck near the trapezius muscle,  it is occult as to the exact dimensions of the area induration 4 x 8 cm is 1 estimate, Cardiovascular: Normal rate, regular rhythm. Grossly normal heart sounds.  Good peripheral circulation. Respiratory: Normal respiratory effort.  No retractions. Lungs CTAB. Abdominal: Soft and nontender. No distention. No guarding no rebound Back:  There is no focal tenderness or step off.  there is no midline tenderness there are no lesions noted. there is no CVA tenderness Musculoskeletal: No lower extremity tenderness, no upper extremity tenderness. No joint effusions, no DVT signs strong distal pulses no edema Neurologic:  Normal speech and language. No gross focal neurologic deficits are appreciated.  Skin:  Skin is warm, dry and intact. No rash noted. Psychiatric: Mood and affect are normal. Speech and behavior are normal.  ____________________________________________   LABS (all labs ordered are listed, but only abnormal results are displayed)  Labs Reviewed  CBC WITH DIFFERENTIAL/PLATELET - Abnormal; Notable for the following components:      Result Value   WBC 15.0 (*)    Neutro Abs 11.2 (*)    Eosinophils Absolute 0.6 (*)    Abs Immature Granulocytes 0.10 (*)    All other components within normal limits  COMPREHENSIVE METABOLIC PANEL - Abnormal; Notable for the following components:   Calcium 8.5 (*)    All other components within normal limits  LACTIC ACID, PLASMA  URINE DRUG SCREEN, QUALITATIVE ([facility] ONLY)    Pertinent labs  results that were available during my care of the patient were reviewed by me and considered in my medical decision making (see chart for details). ____________________________________________  EKG  I personally interpreted any EKGs ordered by me or triage  ____________________________________________  RADIOLOGY  Pertinent labs & imaging results that were available during my care of the patient were reviewed by me and considered in my medical decision making  (see chart for details). If possible, patient and/or family made aware of any abnormal findings.  No results found. ____________________________________________    PROCEDURES  Procedure(s) performed: None  .Marland Kitchen.Incision and Drainage  Date/Time: 09/24/2018 10:53 AM Performed by: Jeanmarie PlantMcShane, James A, MD Authorized by: Jeanmarie PlantMcShane, James A, MD   Consent:    Consent obtained:  Verbal   Consent given by:  Patient   Risks discussed:  Bleeding, incomplete drainage, infection, damage to other organs and pain   Alternatives discussed:  No treatment, delayed treatment, alternative treatment, observation and referral Location:    Type:  Abscess   Size:  3 x 5 x   Location:  Neck   Neck location:  L posterior Pre-procedure details:    Skin preparation:  Antiseptic wash, Betadine and Chloraprep Anesthesia (see MAR for exact dosages):    Anesthesia method:  Local infiltration Procedure type:    Complexity:  Complex Procedure details:    Needle aspiration: no     Incision types:  Single straight   Incision depth:  Subcutaneous   Scalpel blade:  11   Wound management:  Probed and deloculated, irrigated with saline, extensive cleaning and debrided   Drainage:  Purulent   Drainage amount:  Copious   Packing materials:  1/2 in iodoform gauze Post-procedure details:    Patient tolerance of procedure:  Tolerated well, no immediate complications    Critical Care performed: None  ____________________________________________   INITIAL IMPRESSION / ASSESSMENT AND PLAN / ED COURSE  Pertinent labs & imaging results that were available during my care of the patient were reviewed by me and considered in my medical decision making (see chart for details).  Patient with recurrent abscesses presents with an abscess to the back of the neck with surrounding cellulitic changes, we will give him IV antibiotics, I will obtain imaging to see the extent of the abscess as patient has had to have this kind of  thing drained by ENT in the past.  I am concerned that he states that it hurts more to swallow that I can simply be from using the muscles.  He is in no acute respiratory distress and there is nothing to suggest the need to secure his airway.  ----------------------------------------- 9:52 AM on 09/24/2018 -----------------------------------------  I have paged ENT, awaiting their input.  ----------------------------------------- 10:04 AM on 09/24/2018 -----------------------------------------  I have personally reviewed ct awaiting ENT  ----------------------------------------- 10:15 AM on 09/24/2018 -----------------------------------------  Discussed with Dr. Richardson Landry, very much appreciate consult he and I talked about the patient's past medical history, his presentation today, and we both looked at the CT scan, Dr. Richardson Landry feels that his superficial skin infection and drainage with packing should be sufficient, he does agree with admission for IV antibiotics he does suggest possible ID consult as an inpatient given the patient's proclivity towards recurrent abscess formation, patient consents to the procedure, risk benefits alternative explained and understood.  I will send a culture have already given antibiotics, we will performed the procedure and see if we can get him admitted to the hospitalist service given the size of the abscess and the surrounding cellulitis.  ----------------------------------------- 10:52 AM on 09/24/2018 -----------------------------------------  Did talk to the patient extensively, he is reluctant to be admitted to this hospital or any hospital he already owes 15,000 hours from the last visit he states and he does not have the money.  He would prefer to go home and try antibiotics.  He understands risk benefits and alternatives with this particular course of action.  He would like to talk to his mother to see if that is something she agrees with.  If you would  like to be admitted of course have advised that he be admitted, otherwise, we will send him home with close outpatient follow-up.  Abscess has been I&D with very satisfactory results and likely will get better with oral antibiotics but we cannot guarantee it.  ----------------------------------------- 11:46 AM on 09/24/2018 -----------------------------------------  After talking To his mother patient changes his mind would like to be admitted which I think is reasonable.   ____________________________________________   FINAL CLINICAL IMPRESSION(S) / ED DIAGNOSES  Final diagnoses:  None      This chart was dictated using voice recognition software.  Despite best efforts to proofread,  errors can occur which can change meaning.      Charlotte Crumb  A, MD 09/24/18 [id number]    Jeanmarie PlantMcShane, James A, MD 09/24/18 [id number]    Jeanmarie PlantMcShane, James A, MD 09/24/18 1004    Jeanmarie PlantMcShane, James A, MD 09/24/18 1016    Jeanmarie PlantMcShane, James A, MD 09/24/18 1054    Jeanmarie PlantMcShane, James A, MD 09/24/18 1146

## 2018-09-24 NOTE — Consult Note (Signed)
NAME: Bryan Montes  DOB: 02/28/1996  MRN: [id number]  Date/Time: 09/24/2018 3:52 PM  REQUESTING PROVIDER: Luberta MutterKonidena Subjective:  REASON FOR CONSULT: Neck abscess ? Bryan Montes is a 23 y.o. male with a history of recurrent abscesses since the age of 23 presented to the ED with a painful left side of the neck swelling.  Patient says he has had it for a few days now.  It was I&D in the ED and cultures were sent.  He has gotten a dose of cefazolin.  Patient would like to go home.  January 2019 patient was hospitalized for similar lesion- He had I/D and it had propionibacterium and finegoldia. Was seen by ID Dr.fitzgerald and he even questioned chronic granulomatous disease or some immune defiency. Pt has never had pneumonia and the abscess started since the age of 23- it is better now wht topical tretinoin Past Medical History:  Diagnosis Date  . Acute prostatitis     Past Surgical History:  Procedure Laterality Date  . FRACTURE SURGERY      Social History   Socioeconomic History  . Marital status: Single    Spouse name: Not on file  . Number of children: Not on file  . Years of education: Not on file  . Highest education level: Not on file  Occupational History  . Not on file  Social Needs  . Financial resource strain: Not on file  . Food insecurity    Worry: Not on file    Inability: Not on file  . Transportation needs    Medical: Not on file    Non-medical: Not on file  Tobacco Use  . Smoking status: Current Every Day Smoker    Types: Cigars  . Smokeless tobacco: Never Used  Substance and Sexual Activity  . Alcohol use: Yes    Comment: occ  . Drug use: No  . Sexual activity: Not on file  Lifestyle  . Physical activity    Days per week: Not on file    Minutes per session: Not on file  . Stress: Not on file  Relationships  . Social Musicianconnections    Talks on phone: Not on file    Gets together: Not on file    Attends religious service:  Not on file    Active member of club or organization: Not on file    Attends meetings of clubs or organizations: Not on file    Relationship status: Not on file  . Intimate partner violence    Fear of current or ex partner: Not on file    Emotionally abused: Not on file    Physically abused: Not on file    Forced sexual activity: Not on file  Other Topics Concern  . Not on file  Social History Narrative  . Not on file    Family History  Problem Relation Age of Onset  . Breast cancer Maternal Grandmother    No Known Allergies  ? Current Facility-Administered Medications  Medication Dose Route Frequency Provider Last Rate Last Dose  . 0.9 %  sodium chloride infusion   Intravenous Continuous Katha HammingKonidena, Snehalatha, MD 75 mL/hr at 09/24/18 1343    . acetaminophen (TYLENOL) tablet 650 mg  650 mg Oral Q6H PRN Katha HammingKonidena, Snehalatha, MD       Or  . acetaminophen (TYLENOL) suppository 650 mg  650 mg Rectal Q6H PRN Katha HammingKonidena, Snehalatha, MD      . bisacodyl (DULCOLAX) EC tablet 5 mg  5 mg  Oral Daily PRN Epifanio Lesches, MD      . ceFAZolin (ANCEF) IVPB 1 g/50 mL premix  1 g Intravenous Q8H Epifanio Lesches, MD   Stopped at 09/24/18 1412  . docusate sodium (COLACE) capsule 100 mg  100 mg Oral BID Epifanio Lesches, MD      . enoxaparin (LOVENOX) injection 40 mg  40 mg Subcutaneous Q24H Epifanio Lesches, MD      . HYDROcodone-acetaminophen (NORCO/VICODIN) 5-325 MG per tablet 1 tablet  1 tablet Oral Q4H PRN Epifanio Lesches, MD      . ondansetron (ZOFRAN) tablet 4 mg  4 mg Oral Q6H PRN Epifanio Lesches, MD       Or  . ondansetron (ZOFRAN) injection 4 mg  4 mg Intravenous Q6H PRN Epifanio Lesches, MD       Current Outpatient Medications  Medication Sig Dispense Refill  . clindamycin (CLEOCIN) 300 MG capsule Take 1 capsule (300 mg total) by mouth 3 (three) times daily for 10 days. 30 capsule 0     Abtx:  Anti-infectives (From admission, onward)   Start      Dose/Rate Route Frequency Ordered Stop   09/24/18 1400  clindamycin (CLEOCIN) IVPB 600 mg  Status:  Discontinued     600 mg 100 mL/hr over 30 Minutes Intravenous Every 8 hours 09/24/18 1149 09/24/18 1238   09/24/18 1300  ceFAZolin (ANCEF) IVPB 1 g/50 mL premix     1 g 100 mL/hr over 30 Minutes Intravenous Every 8 hours 09/24/18 1240     09/24/18 0830  clindamycin (CLEOCIN) IVPB 600 mg     600 mg 100 mL/hr over 30 Minutes Intravenous  Once 09/24/18 0815 09/24/18 1001   09/24/18 0000  clindamycin (CLEOCIN) 300 MG capsule     300 mg Oral 3 times daily 09/24/18 1058 10/04/18 2359      REVIEW OF SYSTEMS:  Const: negative fever, negative chills, negative weight loss Eyes: negative diplopia or visual changes, negative eye pain ENT: negative coryza, negative sore throat Resp: negative cough, hemoptysis, dyspnea Cards: negative for chest pain, palpitations, lower extremity edema GU: negative for frequency, dysuria and hematuria GI: Negative for abdominal pain, diarrhea, bleeding, constipation Skin: as above Heme: negative for easy bruising and gum/nose bleeding MS: negative for myalgias, arthralgias, back pain and muscle weakness Neurolo:negative for headaches, dizziness, vertigo, memory problems  Psych: negative for feelings of anxiety, depression  Endocrine: negative for thyroid, diabetes Allergy/Immunology- negative for any medication or food allergies ?  Objective:  VITALS:  BP 122/77 (BP Location: Right Arm)   Pulse 69   Temp 98.7 F (37.1 C) (Oral)   Resp 17   Ht 5\' 9"  (1.753 m)   Wt 65.8 kg   SpO2 100%   BMI 21.41 kg/m  PHYSICAL EXAM:  General: Alert, cooperative, no distress, appears stated age.  Head: Normocephalic, without obvious abnormality, atraumatic. Eyes: Conjunctivae clear, anicteric sclerae. Pupils are equal ENT Nares normal. No drainage or sinus tenderness. Lips, mucosa, and tongue normal. No Thrush Face nape of neck, chin, front of chest and upper  back-scars /keloid        Neck: left neck abscess I/D  no carotid bruit and no JVD. Back: No CVA tenderness. Lungs: Clear to auscultation bilaterally. No Wheezing or Rhonchi. No rales. Heart: Regular rate and rhythm, no murmur, rub or gallop. Abdomen: Soft, non-tender,not distended. Bowel sounds normal. No masses Extremities: atraumatic, no cyanosis. No edema. No clubbing Skin: as above Lymph: Cervical, supraclavicular normal. Neurologic: Grossly non-focal Pertinent Labs  Lab Results CBC    Component Value Date/Time   WBC 15.0 (H) 09/24/2018 0633   RBC 5.16 09/24/2018 0633   HGB 14.2 09/24/2018 0633   HCT 44.2 09/24/2018 0633   PLT 258 09/24/2018 0633   MCV 85.7 09/24/2018 0633   MCH 27.5 09/24/2018 0633   MCHC 32.1 09/24/2018 0633   RDW 13.3 09/24/2018 0633   LYMPHSABS 2.2 09/24/2018 0633   MONOABS 0.9 09/24/2018 0633   EOSABS 0.6 (H) 09/24/2018 0633   BASOSABS 0.1 09/24/2018 0633    CMP Latest Ref Rng & Units 09/24/2018 04/04/2017 04/03/2017  Glucose 70 - 99 mg/dL 94 409(W103(H) 99  BUN 6 - 20 mg/dL 11 10 8   Creatinine 0.61 - 1.24 mg/dL 1.190.77 1.470.68 8.290.81  Sodium 135 - 145 mmol/L 135 138 136  Potassium 3.5 - 5.1 mmol/L 3.8 4.3 3.5  Chloride 98 - 111 mmol/L 102 103 100(L)  CO2 22 - 32 mmol/L 25 27 27   Calcium 8.9 - 10.3 mg/dL 5.6(O8.5(L) 8.9 1.3(Y8.7(L)  Total Protein 6.5 - 8.1 g/dL 7.8 - -  Total Bilirubin 0.3 - 1.2 mg/dL 0.5 - -  Alkaline Phos 38 - 126 U/L 85 - -  AST 15 - 41 U/L 39 - -  ALT 0 - 44 U/L 35 - -      Microbiology: Recent Results (from the past 240 hour(s))  Wound or Superficial Culture     Status: None (Preliminary result)   Collection Time: 09/24/18 10:25 AM   Specimen: Wound  Result Value Ref Range Status   Specimen Description   Final    WOUND LEFT NECK Performed at [facility], [address] [place], [place] [address]    Special Requests   Final    Normal Performed at [facility], [address] [place], [place] [address]    Gram  Stain   Final    FEW WBC PRESENT, PREDOMINANTLY PMN MODERATE GRAM NEGATIVE RODS MODERATE GRAM POSITIVE COCCI Performed at [facility], [address] [place], [place] [address]    Culture PENDING  Incomplete   Report Status PENDING  Incomplete  SARS Coronavirus 2 (CEPHEID - Performed in [facility] lab), Hosp Order     Status: None   Collection Time: 09/24/18 11:28 AM   Specimen: Nasopharyngeal Swab  Result Value Ref Range Status   SARS Coronavirus 2 NEGATIVE NEGATIVE Final    Comment: (NOTE) If result is NEGATIVE SARS-CoV-2 target nucleic acids are NOT DETECTED. The SARS-CoV-2 RNA is generally detectable in upper and lower  respiratory specimens during the acute phase of infection. The lowest  concentration of SARS-CoV-2 viral copies this assay can detect is 250  copies / mL. A negative result does not preclude SARS-CoV-2 infection  and should not be used as the sole basis for treatment or other  patient management decisions.  A negative result may occur with  improper specimen collection / handling, submission of specimen other  than nasopharyngeal swab, presence of viral mutation(s) within the  areas targeted by this assay, and inadequate number of viral copies  (<250 copies / mL). A negative result must be combined with clinical  observations, patient history, and epidemiological information. If result is POSITIVE SARS-CoV-2 target nucleic acids are DETECTED. The SARS-CoV-2 RNA is generally detectable in upper and lower  respiratory specimens dur ing the acute phase of infection.  Positive  results are indicative of active infection with SARS-CoV-2.  Clinical  correlation with patient history and other  diagnostic information is  necessary to determine patient infection status.  Positive results do  not rule out bacterial infection or co-infection with other viruses. If result is PRESUMPTIVE POSTIVE SARS-CoV-2 nucleic acids MAY BE PRESENT.   A presumptive  positive result was obtained on the submitted specimen  and confirmed on repeat testing.  While 2019 novel coronavirus  (SARS-CoV-2) nucleic acids may be present in the submitted sample  additional confirmatory testing may be necessary for epidemiological  and / or clinical management purposes  to differentiate between  SARS-CoV-2 and other Sarbecovirus currently known to infect humans.  If clinically indicated additional testing with an alternate test  methodology [phone]([id number]) is advised. The SARS-CoV-2 RNA is generally  detectable in upper and lower respiratory sp ecimens during the acute  phase of infection. The expected result is Negative. Fact Sheet for Patients:  BoilerBrush.com.cyhttps://www.fda.gov/media/136312/download Fact Sheet for Healthcare Providers: https://pope.com/https://www.fda.gov/media/136313/download This test is not yet approved or cleared by the [place] FDA and has been authorized for detection and/or diagnosis of SARS-CoV-2 by FDA under an Emergency Use Authorization (EUA).  This EUA will remain in effect (meaning this test can be used) for the duration of the COVID-19 declaration under Section 564(b)(1) of the Act, 21 U.S.C. section 360bbb-3(b)(1), unless the authorization is terminated or revoked sooner. Performed at [facility], [address] [place], [place] [address]     IMAGING RESULTS: I have personally reviewed the films ? Impression/Recommendation ?23 y.o. male with a history of recurrent abscesses since the age of 23 presented to the ED with a painful left side of the neck swelling.  Patient says he has had it for a few days now.  It was I&D in the ED and cultures were sent.  He has gotten a dose of cefazolin. ? Nodular and cystic lesion left neck / scars over the face, upper back and front of chest - distribution is in acne area- this is nodulocystic acne,  Hidradenitis suppurativa or SAPHO less likely   Pt should see dermatologist and there are few options  including long term doxy+ topical tretinoin,  _________ pt wants to go home and can be sent on Doxy. will follow culture __________________________________________ Discussed with patient, requesting provider Note:  This document was prepared using Dragon voice recognition software and may include unintentional dictation errors.

## 2018-09-24 NOTE — Progress Notes (Signed)
Patient initially wanted to go [place] now is willing to stay, continue IV antibiotics.  ID consult is requested.  Please send off the cultures from the abscess-drained in the emergency room.

## 2018-09-24 NOTE — ED Notes (Signed)
This RN apologized for delay, admitting MD paged again regarding patient wanting to leave AMA. Pt A&O x 4, NAD noted at this time.

## 2018-09-24 NOTE — Discharge Instructions (Signed)
You would prefer not to stay in the hospital, this is your choice but limits our ability to take care of you increase the risk that you might get worse as we talked about., please return to the emergency room or your primary care doctor tomorrow for a wound check.  If you have fever, increased pain increased swelling, trouble swallowing, or other symptoms of concern please return to the ER.  Take the antibiotics as directed until they are gone.

## 2018-09-24 NOTE — ED Notes (Signed)
Pt left AMA, D/C/AMA paperwork printed from AVS summary. Pt denies comments/concerns, signs AMA paperwork. Pt ambulatory to the lobby without difficulty.

## 2018-09-24 NOTE — H&P (Signed)
[facility] [place] [facility]   PATIENT NAME: Bryan ModeJesse Muro Montes    MR#:  [id number]  DATE OF BIRTH:  07/27/1995  DATE OF ADMISSION:  09/24/2018  PRIMARY CARE PHYSICIAN: [place], [facility]   REQUESTING/REFERRING PHYSICIAN: Dr. Alphonzo LemmingsMcShane  CHIEF COMPLAINT: Left neck infection   Chief Complaint  Patient presents with  . Abscess    HISTORY OF PRESENT ILLNESS:  Bryan ModeJesse Muro Montes  is a 23 y.o. male with a known history of current neck abscess comes in because of left neck pain, abscess.  Going on for 1 week.  Denies any fever, infection in the tooth.  Patient states that because of swelling he is unable to swallow.  Patient had a history of recurrent abscesses in different part of the body and he says that usually they self drain, patient was admitted last year with the same problem.  ER physician Dr. Alphonzo LemmingsMcShane spoke with Dr. Willeen CassBennett from ENT, CT scan of the neck pictures reviewed by him, he recommended inpatient admission, ID consult.  Abscess was drained by ER physician, sent for cultures.  PAST MEDICAL HISTORY:   Past Medical History:  Diagnosis Date  . Acute prostatitis     PAST SURGICAL HISTOIRY:   Past Surgical History:  Procedure Laterality Date  . FRACTURE SURGERY      SOCIAL HISTORY:   Social History   Tobacco Use  . Smoking status: Current Every Day Smoker    Types: Cigars  . Smokeless tobacco: Never Used  Substance Use Topics  . Alcohol use: Yes    Comment: occ    FAMILY HISTORY:   Family History  Problem Relation Age of Onset  . Breast cancer Maternal Grandmother     DRUG ALLERGIES:  No Known Allergies  REVIEW OF SYSTEMS:  CONSTITUTIONAL: No fever, fatigue or weakness.  EYES: No blurred or double vision.  EARS, NOSE, AND THROAT: No tinnitus or ear pain.  RESPIRATORY: No cough, shortness of breath, wheezing or hemoptysis.  CARDIOVASCULAR: No chest pain, orthopnea, edema.  GASTROINTESTINAL: No nausea,  vomiting, diarrhea or abdominal pain.  GENITOURINARY: No dysuria, hematuria.  ENDOCRINE: No polyuria, nocturia,  HEMATOLOGY: No anemia, easy bruising or bleeding SKIN: No rash or lesion. MUSCULOSKELETAL: No joint pain or arthritis.   NEUROLOGIC: No tingling, numbness, weakness.  PSYCHIATRY: No anxiety or depression.   MEDICATIONS AT HOME:   Prior to Admission medications   Medication Sig Start Date End Date Taking? Authorizing Provider  acetaminophen (TYLENOL) 325 MG tablet Take 2 tablets (650 mg total) by mouth every 6 (six) hours as needed for mild pain (or Fever >/= 101). 04/05/17   Ramonita LabGouru, Aruna, MD  clindamycin (CLEOCIN) 300 MG capsule Take 1 capsule (300 mg total) by mouth 3 (three) times daily for 10 days. 09/24/18 10/04/18  Jeanmarie PlantMcShane, James A, MD  gentamicin ointment (GARAMYCIN) 0.1 % Apply topically 3 (three) times daily. 04/05/17   Ramonita LabGouru, Aruna, MD  ibuprofen (ADVIL,MOTRIN) 200 MG tablet Take 200-400 mg by mouth every 6 (six) hours as needed.    [provider]  ibuprofen (ADVIL,MOTRIN) 600 MG tablet Take 1 tablet (600 mg total) by mouth every 8 (eight) hours as needed. 01/07/18   Joni ReiningSmith, Ronald K, PA-C  Multiple Vitamin (MULTIVITAMIN WITH MINERALS) TABS tablet Take 1 tablet by mouth daily.    [provider]  ondansetron (ZOFRAN ODT) 4 MG disintegrating tablet Take 1 tablet (4 mg total) by mouth every 8 (eight) hours as needed for nausea or vomiting.  04/23/18   Darel Hong, MD      VITAL SIGNS:  Blood pressure 120/70, pulse 98, temperature 98.8 F (37.1 C), temperature source Oral, resp. rate 17, height 5\' 9"  (1.753 m), weight 65.8 kg, SpO2 100 %.  PHYSICAL EXAMINATION:  GENERAL:  23 y.o.-year-old patient lying in the bed with no acute distress.  EYES: Pupils equal, round, reactive to light and accommodation. No scleral icterus. Extraocular muscles intact.  HEENT: Head atraumatic, normocephalic. Oropharynx and nasopharynx clear.  Patient dentition is good, oral  hygiene is good. NECK: Left neck abscess drained by ER physician, no dressing present.  According to patient it is less tender.   LUNGS: Normal breath sounds bilaterally, no wheezing, rales,rhonchi or crepitation. No use of accessory muscles of respiration.  CARDIOVASCULAR: S1, S2 normal. No murmurs, rubs, or gallops.  ABDOMEN: Soft, nontender, nondistended. Bowel sounds present. No organomegaly or mass.  EXTREMITIES: No pedal edema, cyanosis, or clubbing.  NEUROLOGIC: Cranial nerves II through XII are intact. Muscle strength 5/5 in all extremities. Sensation intact. Gait not checked.  PSYCHIATRIC: The patient is alert and oriented x 3.  SKIN: No obvious rash, lesion, or ulcer.   LABORATORY PANEL:   CBC Recent Labs  Lab 09/24/18 0633  WBC 15.0*  HGB 14.2  HCT 44.2  PLT 258   ------------------------------------------------------------------------------------------------------------------  Chemistries  Recent Labs  Lab 09/24/18 0633  NA 135  K 3.8  CL 102  CO2 25  GLUCOSE 94  BUN 11  CREATININE 0.77  CALCIUM 8.5*  AST 39  ALT 35  ALKPHOS 85  BILITOT 0.5   ------------------------------------------------------------------------------------------------------------------  Cardiac Enzymes No results for input(s): TROPONINI in the last 168 hours. 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 EXAM: CT NECK WITH CONTRAST TECHNIQUE: Multidetector CT imaging of the neck was performed using the standard protocol following the bolus administration of intravenous contrast. CONTRAST:  76mL OMNIPAQUE IOHEXOL 350 MG/ML SOLN COMPARISON:  04/02/2017 FINDINGS: Pharynx and larynx: Mild motion artifact at the larynx. Mild tonsillar hypertrophy appears physiologic. Laryngeal and  pharyngeal soft tissue contours are within normal limits. Negative parapharyngeal and retropharyngeal spaces. Salivary glands: Sublingual space, submandibular glands, and parotid glands are within normal limits. Thyroid: Negative. Lymph nodes: Along the left posterior neck in the same area which was affected in 2019 there is an irregular rim enhancing multilocular fluid collection encompassing 35 by 20 x 53 millimeters (AP by transverse by CC) with a combination of simple and complex internal fluid. There is a small nearby satellite lesion located slightly posterior and inferior to the dominant collection (series 2, image 50 and coronal image 84). As before, the lesion involves both the subcutaneous layer air and the left trapezius and posterior margin of the left sternocleidomastoid muscles. There is regional cellulitis. No soft tissue gas. Similar reactive appearing left level 2 and level 3 lymphadenopathy. Level 5 is relatively spared. Affected nodes measure up to 10 millimeters short axis. No cystic or necrotic nodes are identified. There is a small residual 9 millimeter oval fluid collection in the contralateral right neck corresponding to the area of a rim enhancing collection in 2019 (series 2, image 44). This does not have an active appearance. Level 1 lymph nodes are within normal limits but right side level 2 lymph nodes are also prominent up to 12-13 millimeter short axis, stable since 2019. Vascular: The major vascular structures in the neck including the  left external and internal jugular veins are patent. Major vascular structures at the skull base appear patent. Limited intracranial: Negative. Visualized orbits: Negative. Mastoids and visualized paranasal sinuses: Clear. Skeleton: Negative. No osseous abnormality identified. Upper chest: Negative lung apices, azygos fissure (normal variant). Negative visible superior mediastinum. IMPRESSION: 1. Recurrent left posterior neck abscess, 35 x 20 x 53 mm  (estimated volume 19 mL - see sagittal image 101), involving the subcutaneous layer as well as portions of the left trapezius and sternocleidomastoid muscles. Associated regional cellulitis. No soft tissue gas. 2. Reactive appearing cervical lymphadenopathy. No cystic or necrotic nodes. 3. Small remnant of the small contralateral 2019 right neck abscess (series 2, image 44). Electronically Signed   By: Odessa FlemingH  Hall M.D.   On: 09/24/2018 09:21    EKG:  No orders found for this or any previous visit.  IMPRESSION AND PLAN:  1/recurrent left neck abscess, status post drainage by ER physician, abscess involving trapezius on left, also involving left sternocleidal muscles with regional cellulitis, reactive lymphadenopathy, admit the patient to hospitalist service, start IV antibiotics, obtain ED, ENT consult.  #2. leukocytosis secondary to left neck abscess, patient is clinically feeling better, follow cultures, follow white count. 3.  Recurrent left neck abscess, abscesses at different places, patient was given tretinoin and also was seen by Dr. Sampson GoonFitzgerald, patient had immunoglobulins checked last year #4./ rapid COVID-19 test has been negative All the records are rev severe iewed and case discussed with ED provider. Management plans discussed with the patient, family and they are in agreement.  CODE STATUS: full  TOTAL TIME TAKING CARE OF THIS PATIENT: 55 minutes.    Katha HammingSnehalatha Kazi Montoro M.D on 09/24/2018 at 12:11 PM  Between 7am to 6pm - Pager - [phone]  After 6pm go to [email or web address] - password [email or web address]  [facility]  Office  [phone]  CC: Primary care physician; Jerrilyn [place], [facility]  Note: This dictation was prepared with Dragon dictation along with smaller phrase technology. Any transcriptional errors that result from this process are unintentional.

## 2018-09-24 NOTE — ED Triage Notes (Signed)
Patient coming back in tonight because he was supposed to be admitted this morning but he left AMA and realized he probably does need to be admitted and started his oral antibiotics tonight but has had chronic issues with abscesses in the past and normally does need IV antibiotics

## 2018-09-24 NOTE — ED Triage Notes (Addendum)
Patient ambulatory to triage with steady gait, without difficulty or distress noted, mask in place; pt reports abscess to left side of neck x 8 days; st hx of same with I&D; swelling/redness to left side of neck noted with pain that increases with swallowing

## 2018-09-24 NOTE — ED Notes (Signed)
Pt in with co pain and swelling to left neck. States has had abscesses in the past, was here earlier but left AMA. States he was advised to be admitted, after leaving AMA he is now returning for admission.

## 2018-09-24 NOTE — ED Provider Notes (Signed)
[facility] Emergency Department Provider Note   ____________________________________________    I have reviewed the triage vital signs and the nursing notes.   HISTORY  Chief Complaint Abscess to the neck    HPI [facility] Bryan Montes is a 23 y.o. male who was here earlier this morning and admitted to the hospital after EDP I&D of left neck abscess.  Consultations with ENT recommended admission for IV antibiotics.  The patient apparently decided to leave [place] but has now changed his mind and is ready to be admitted.  Denies any new symptoms.  Past Medical History:  Diagnosis Date  . Acute prostatitis     Patient Active Problem List   Diagnosis Date Noted  . Cellulitis 09/24/2018  . Neck abscess 04/02/2017    Past Surgical History:  Procedure Laterality Date  . FRACTURE SURGERY      Prior to Admission medications   Medication Sig Start Date End Date Taking? Authorizing Provider  doxycycline (VIBRAMYCIN) 50 MG capsule Take 2 capsules (100 mg total) by mouth 2 (two) times daily. Take doxycycline 100 mg p.o. twice daily for 1 week, patient to follow-up on cultures that are done with abscess drainage from the left neck. 09/24/18   Epifanio Lesches, MD     Allergies Patient has no known allergies.  Family History  Problem Relation Age of Onset  . Breast cancer Maternal Grandmother     Social History Social History   Tobacco Use  . Smoking status: Current Every Day Smoker    Types: Cigars  . Smokeless tobacco: Never Used  Substance Use Topics  . Alcohol use: Yes    Comment: occ  . Drug use: No    Review of Systems  Constitutional: No fever/chills Eyes: No visual changes.  ENT: Left-sided neck pain, no difficulty swallowing Cardiovascular: Denies chest pain. Respiratory: Denies shortness of breath. Gastrointestinal: No abdominal pain.   Genitourinary: Negative for dysuria. Musculoskeletal: Negative  for back pain. Skin: Negative for rash. Neurological: Negative for headaches   ____________________________________________   PHYSICAL EXAM:  VITAL SIGNS: ED Triage Vitals  Enc Vitals Group     BP 09/24/18 1958 129/72     Pulse Rate 09/24/18 1958 100     Resp 09/24/18 1958 18     Temp 09/24/18 1958 98.5 F (36.9 C)     Temp Source 09/24/18 1958 Oral     SpO2 09/24/18 1958 97 %     Weight 09/24/18 1958 65.8 kg (145 lb)     Height 09/24/18 1958 1.753 m (5\' 9" )     Head Circumference --      Peak Flow --      Pain Score 09/24/18 2003 2     Pain Loc --      Pain Edu? --      Excl. in [place]? --     Constitutional: Alert and oriented.  Nose: No congestion/rhinnorhea. Mouth/Throat: Mucous membranes are moist.  Pharynx normal Neck: Left neck abscess, superficial, purulent drainage Cardiovascular: Normal rate, regular rhythm. Grossly normal heart sounds.  Good peripheral circulation. Respiratory: Normal respiratory effort.  No retractions. Lungs CTAB. Gastrointestinal: Soft and nontender. No distention.    Musculoskeletal: No lower extremity tenderness nor edema.   Neurologic:  Normal speech and language. No gross focal neurologic deficits are appreciated.  Skin:  Skin is warm, dry and intact. No rash noted. Psychiatric: Mood and affect are normal. Speech and behavior are normal.  ____________________________________________   LABS (  all labs ordered are listed, but only abnormal results are displayed)  Labs Reviewed - No data to display ____________________________________________  EKG None ____________________________________________  RADIOLOGY  None ____________________________________________   PROCEDURES  Procedure(s) performed: No  Procedures   Critical Care performed: No ____________________________________________   INITIAL IMPRESSION / ASSESSMENT AND PLAN / ED COURSE  Pertinent labs & imaging results that were available during my care of the  patient were reviewed by me and considered in my medical decision making (see chart for details).  We will start IV antibiotics and admit to the hospitalist service.     ____________________________________________   FINAL CLINICAL IMPRESSION(S) / ED DIAGNOSES  Final diagnoses:  Neck abscess        Note:  This document was prepared using Dragon voice recognition software and may include unintentional dictation errors.   Jene EveryKinner, India Jolin, MD 09/24/18 2214

## 2018-09-24 NOTE — ED Notes (Signed)
Pt requesting something to eat, considering leaving AMA after abx, MD messaged via secure chat of patient wanting to leave. Pt agreeable to staying for abx. Will continue to monitor. Pt given lunch tray at this time.

## 2018-09-24 NOTE — ED Notes (Signed)
hospitalist into admit

## 2018-09-24 NOTE — ED Notes (Signed)
Admitting MD notified via secure chat regarding patient requesting to leave AMA. Will continue to monitor for further patient needs.

## 2018-09-24 NOTE — ED Notes (Signed)
Report called to [facility] , pt admitted to 147

## 2018-09-24 NOTE — ED Notes (Signed)
Dr. Estanislado Pandy at bedside to assess patient and address concerns regarding patient leaving AMA.

## 2018-09-24 NOTE — ED Notes (Signed)
Pt given food tray.

## 2018-09-24 NOTE — ED Notes (Signed)
Per Dr. Estanislado Pandy pt is okay to sign out AMA.

## 2018-09-24 NOTE — ED Notes (Signed)
ED TO INPATIENT HANDOFF REPORT  ED Nurse Name and Phone #: Batool Majid  [phone]  S Name/Age/Gender [facility] 23 [facility] Room/Bed: ED30A/ED30A  Code Status   Code Status: Prior  Home/SNF/Other Home Patient oriented to: self, place, time and situation Is this baseline? 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 and normally does need IV antibiotics    Allergies No Known Allergies  Level of Care/Admitting Diagnosis ED Disposition    ED Disposition Condition Comment   Admit  [facility] [[id number]]  Level of Care: Med-Surg [16]  Covid Evaluation: Confirmed COVID Negative  Diagnosis: Abscess, neck [[id number]][[id number]]  Admitting Physician: Oralia ManisWILLIS, Damante Spragg [[id number]]  Attending Physician: Oralia ManisWILLIS, Olman Yono [[id number]]  PT Class (Do Not Modify): Observation [104]  PT Acc Code (Do Not Modify): Observation [10022]       B Medical/Surgery History Past Medical History:  Diagnosis Date  . Acute prostatitis    Past Surgical History:  Procedure Laterality Date  . FRACTURE SURGERY       A IV Location/Drains/Wounds Patient Lines/Drains/Airways Status   Active Line/Drains/Airways    Name:   Placement date:   Placement time:   Site:   Days:   Peripheral IV 09/24/18 Right Antecubital   09/24/18    2210    Antecubital   less than 1          Intake/Output Last 24 hours No intake or output data in the 24 hours ending 09/24/18 2251  Labs/Imaging Results for orders placed or performed during the hospital encounter of 09/24/18 (from the past 48 hour(s))  CBC with Differential     Status: Abnormal   Collection Time: 09/24/18  6:33 AM  Result Value Ref Range   WBC 15.0  (H) 4.0 - 10.5 K/uL   RBC 5.16 4.22 - 5.81 MIL/uL   Hemoglobin 14.2 13.0 - 17.0 g/dL   HCT 29.544.2 62.139.0 - 30.852.0 %   MCV 85.7 80.0 - 100.0 fL   MCH 27.5 26.0 - 34.0 pg   MCHC 32.1 30.0 - 36.0 g/dL   RDW 65.713.3 84.611.5 - 96.215.5 %   Platelets 258 150 - 400 K/uL   nRBC 0.0 0.0 - 0.2 %   Neutrophils Relative % 73 %   Neutro Abs 11.2 (H) 1.7 - 7.7 K/uL   Lymphocytes Relative 15 %   Lymphs Abs 2.2 0.7 - 4.0 K/uL   Monocytes Relative 6 %   Monocytes Absolute 0.9 0.1 - 1.0 K/uL   Eosinophils Relative 4 %   Eosinophils Absolute 0.6 (H) 0.0 - 0.5 K/uL   Basophils Relative 1 %   Basophils Absolute 0.1 0.0 - 0.1 K/uL   Immature Granulocytes 1 %   Abs Immature Granulocytes 0.10 (H) 0.00 - 0.07 K/uL    Comment: Performed at [facility], [address] [place], [place] [address]  Comprehensive metabolic panel     Status: Abnormal   Collection Time: 09/24/18  6:33 AM  Result Value Ref Range   Sodium 135 135 - 145 mmol/L   Potassium 3.8 3.5 - 5.1 mmol/L   Chloride 102 98 - 111 mmol/L   CO2 25 22 - 32 mmol/L   Glucose, Bld 94  70 - 99 mg/dL   BUN 11 6 - 20 mg/dL   Creatinine, Ser 0.77 0.61 - 1.24 mg/dL   Calcium 8.5 (L) 8.9 - 10.3 mg/dL   Total Protein 7.8 6.5 - 8.1 g/dL   Albumin 4.1 3.5 - 5.0 g/dL   AST 39 15 - 41 U/L   ALT 35 0 - 44 U/L   Alkaline Phosphatase 85 38 - 126 U/L   Total Bilirubin 0.5 0.3 - 1.2 mg/dL   GFR calc non Af Amer >60 >60 mL/min   GFR calc Af Amer >60 >60 mL/min   Anion gap 8 5 - 15    Comment: Performed at [facility], [address] [place], [place] [address]  Lactic acid, plasma     Status: None   Collection Time: 09/24/18  6:33 AM  Result Value Ref Range   Lactic Acid, Venous 1.5 0.5 - 1.9 mmol/L    Comment: Performed at [facility], [address], [place], [place] [address]  Urine Drug Screen, Qualitative     Status: Abnormal   Collection Time: 09/24/18  7:40 AM  Result Value Ref Range   Tricyclic, Ur Screen NONE DETECTED NONE  DETECTED   Amphetamines, Ur Screen NONE DETECTED NONE DETECTED   MDMA (Ecstasy)Ur Screen NONE DETECTED NONE DETECTED   Cocaine Metabolite,Ur [place] NONE DETECTED NONE DETECTED   Opiate, Ur Screen NONE DETECTED NONE DETECTED   Phencyclidine (PCP) Ur S NONE DETECTED NONE DETECTED   Cannabinoid 50 Ng, Ur [place] POSITIVE (A) NONE DETECTED   Barbiturates, Ur Screen NONE DETECTED NONE DETECTED   Benzodiazepine, Ur Scrn NONE DETECTED NONE DETECTED   Methadone Scn, Ur NONE DETECTED NONE DETECTED    Comment: (NOTE) Tricyclics + metabolites, urine    Cutoff 1000 ng/mL Amphetamines + metabolites, urine  Cutoff 1000 ng/mL MDMA (Ecstasy), urine              Cutoff 500 ng/mL Cocaine Metabolite, urine          Cutoff 300 ng/mL Opiate + metabolites, urine        Cutoff 300 ng/mL Phencyclidine (PCP), urine         Cutoff 25 ng/mL Cannabinoid, urine                 Cutoff 50 ng/mL Barbiturates + metabolites, urine  Cutoff 200 ng/mL Benzodiazepine, urine              Cutoff 200 ng/mL Methadone, urine                   Cutoff 300 ng/mL The urine drug screen provides only a preliminary, unconfirmed analytical test result and should not be used for non-medical purposes. Clinical consideration and professional judgment should be applied to any positive drug screen result due to possible interfering substances. A more specific alternate chemical method must be used in order to obtain a confirmed analytical result. Gas chromatography / mass spectrometry (GC/MS) is the preferred confirmat ory method. Performed at [facility], [address] [place], [place] [address]   Wound or Superficial Culture     Status: None (Preliminary result)   Collection Time: 09/24/18 10:25 AM   Specimen: Wound  Result Value Ref Range   Specimen Description      WOUND LEFT NECK Performed at [facility], [place] [address], [place], [place] [address]    Special Requests      Normal Performed at [facility], [address] [place], [place] [address]    Gram Stain      FEW WBC PRESENT, PREDOMINANTLY PMN MODERATE GRAM NEGATIVE RODS MODERATE GRAM POSITIVE COCCI Performed at [facility], [address] [place], [place] [address]    Culture PENDING    Report Status PENDING   SARS Coronavirus 2 (CEPHEID - Performed in [facility] hospital lab), Hosp Order     Status: None   Collection Time: 09/24/18 11:28 AM   Specimen: Nasopharyngeal Swab  Result Value Ref Range   SARS Coronavirus 2 NEGATIVE NEGATIVE    Comment: (NOTE) If result is NEGATIVE SARS-CoV-2 target nucleic acids are NOT DETECTED. The SARS-CoV-2 RNA is generally detectable in upper and lower  respiratory specimens during the acute phase of infection. The lowest  concentration of SARS-CoV-2 viral copies this assay can detect is 250  copies / mL. A negative result does not preclude SARS-CoV-2 infection  and should not be used as the sole basis for treatment or other  patient management decisions.  A negative result may occur with  improper specimen collection / handling, submission of specimen other  than nasopharyngeal swab, presence of viral mutation(s) within the  areas targeted by this assay, and inadequate number of viral copies  (<250 copies / mL). A negative result must be combined with clinical  observations, patient history, and epidemiological information. If result is POSITIVE SARS-CoV-2 target nucleic acids are DETECTED. The SARS-CoV-2 RNA is generally detectable in upper and lower  respiratory specimens dur ing the acute phase of infection.  Positive  results are indicative of active infection with SARS-CoV-2.  Clinical  correlation with patient history and other diagnostic information is  necessary to determine patient infection status.  Positive results do  not rule out bacterial infection or co-infection with other viruses. If result is PRESUMPTIVE POSTIVE SARS-CoV-2 nucleic acids MAY BE PRESENT.    A presumptive positive result was obtained on the submitted specimen  and confirmed on repeat testing.  While 2019 novel coronavirus  (SARS-CoV-2) nucleic acids may be present in the submitted sample  additional confirmatory testing may be necessary for epidemiological  and / or clinical management purposes  to differentiate between  SARS-CoV-2 and other Sarbecovirus currently known to infect humans.  If clinically indicated additional testing with an alternate test  methodology [phone] is advised. The SARS-CoV-2 RNA is generally  detectable in upper and lower respiratory sp ecimens during the acute  phase of infection. The expected result is Negative. Fact Sheet for Patients:  BoilerBrush.com.cyhttps://www.fda.gov/media/136312/download Fact Sheet for Healthcare Providers: https://pope.com/https://www.fda.gov/media/136313/download This test is not yet approved or cleared by the [place] FDA and has been authorized for detection and/or diagnosis of SARS-CoV-2 by FDA under an Emergency Use Authorization (EUA).  This EUA will remain in effect (meaning this test can be used) for the duration of the COVID-19 declaration under Section 564(b)(1) of the Act, 21 U.S.C. section 360bbb-3(b)(1), unless the authorization is terminated or revoked sooner. Performed at [facility], [address] [place], [place] [address]    Ct Soft Tissue Neck W Contrast  Result Date: 09/24/2018 CLINICAL DATA:  23 year old male with history of recurrent neck abscesses. EXAM: CT NECK WITH CONTRAST TECHNIQUE: Multidetector CT imaging of the neck was performed using the standard protocol following the bolus administration of intravenous contrast. CONTRAST:  75mL OMNIPAQUE IOHEXOL 350 MG/ML SOLN COMPARISON:  04/02/2017 FINDINGS: Pharynx and larynx: Mild motion artifact at the larynx. Mild tonsillar hypertrophy appears physiologic. Laryngeal and pharyngeal soft tissue  contours are within normal limits. Negative parapharyngeal and  retropharyngeal spaces. Salivary glands: Sublingual space, submandibular glands, and parotid glands are within normal limits. Thyroid: Negative. Lymph nodes: Along the left posterior neck in the same area which was affected in 2019 there is an irregular rim enhancing multilocular fluid collection encompassing 35 by 20 x 53 millimeters (AP by transverse by CC) with a combination of simple and complex internal fluid. There is a small nearby satellite lesion located slightly posterior and inferior to the dominant collection (series 2, image 50 and coronal image 84). As before, the lesion involves both the subcutaneous layer air and the left trapezius and posterior margin of the left sternocleidomastoid muscles. There is regional cellulitis. No soft tissue gas. Similar reactive appearing left level 2 and level 3 lymphadenopathy. Level 5 is relatively spared. Affected nodes measure up to 10 millimeters short axis. No cystic or necrotic nodes are identified. There is a small residual 9 millimeter oval fluid collection in the contralateral right neck corresponding to the area of a rim enhancing collection in 2019 (series 2, image 44). This does not have an active appearance. Level 1 lymph nodes are within normal limits but right side level 2 lymph nodes are also prominent up to 12-13 millimeter short axis, stable since 2019. Vascular: The major vascular structures in the neck including the left external and internal jugular veins are patent. Major vascular structures at the skull base appear patent. Limited intracranial: Negative. Visualized orbits: Negative. Mastoids and visualized paranasal sinuses: Clear. Skeleton: Negative. No osseous abnormality identified. Upper chest: Negative lung apices, azygos fissure (normal variant). Negative visible superior mediastinum. IMPRESSION: 1. Recurrent left posterior neck abscess, 35 x 20 x 53 mm (estimated volume 19 mL - see sagittal image 101), involving the subcutaneous layer as  well as portions of the left trapezius and sternocleidomastoid muscles. Associated regional cellulitis. No soft tissue gas. 2. Reactive appearing cervical lymphadenopathy. No cystic or necrotic nodes. 3. Small remnant of the small contralateral 2019 right neck abscess (series 2, image 44). Electronically Signed   By: Odessa FlemingH  Hall M.D.   On: 09/24/2018 09:21    Pending Labs [employer]resulted Labs (From admission, onward)    Start     Ordered   Signed and Held  HIV antibody (Routine Testing)  Once,   R     Signed and Held   Signed and Held  CBC  (enoxaparin (LOVENOX)    CrCl >/= 30 ml/min)  Once,   R    Comments: Baseline for enoxaparin therapy IF NOT ALREADY DRAWN.  Notify MD if PLT < 100 K.    Signed and Held   Signed and Held  Creatinine, serum  (enoxaparin (LOVENOX)    CrCl >/= 30 ml/min)  Once,   R    Comments: Baseline for enoxaparin therapy IF NOT ALREADY DRAWN.    Signed and Held   Signed and Held  Creatinine, serum  (enoxaparin (LOVENOX)    CrCl >/= 30 ml/min)  Weekly,   R    Comments: while on enoxaparin therapy    Signed and Held   Signed and Held  Basic metabolic panel  Tomorrow morning,   R     Signed and Held   Signed and Held  CBC  Tomorrow morning,   R     Signed and Held          Vitals/Pain Today's Vitals   09/24/18 1958 09/24/18 2003 09/24/18 2006 09/24/18 2203  BP: 129/72   119/70  Pulse: 100   81  Resp: 18   20  Temp: 98.5 F (36.9 C)     TempSrc: Oral     SpO2: 97%   98%  Weight: 65.8 kg  65.8 kg   Height:  (1.753 m)   (1.753 m)   PainSc:  2   7     Isolation Precautions No active isolations  Medications Medications  clindamycin (CLEOCIN) IVPB 600 mg (0 mg Intravenous Stopped 09/24/18 2246)    Mobility walks Low fall risk   Focused Assessments Abscess   R Recommendations: See Admitting Provider Note  Report given to:   Additional Notes:

## 2018-09-25 LAB — BASIC METABOLIC PANEL
Anion gap: 8 (ref 5–15)
BUN: 7 mg/dL (ref 6–20)
CO2: 22 mmol/L (ref 22–32)
Calcium: 8.4 mg/dL — ABNORMAL LOW (ref 8.9–10.3)
Chloride: 107 mmol/L (ref 98–111)
Creatinine, Ser: 0.73 mg/dL (ref 0.61–1.24)
GFR calc Af Amer: >60 mL/min (ref >60)
GFR calc non Af Amer: >60 mL/min (ref >60)
Glucose, Bld: 110 mg/dL — ABNORMAL HIGH (ref 70–99)
Potassium: 3.8 mmol/L (ref 3.5–5.1)
Sodium: 137 mmol/L (ref 135–145)

## 2018-09-25 LAB — CBC
HCT: 43.2 % (ref 39.0–52.0)
Hemoglobin: 14.2 g/dL (ref 13.0–17.0)
MCH: 27.3 pg (ref 26.0–34.0)
MCHC: 32.9 g/dL (ref 30.0–36.0)
MCV: 83.1 fL (ref 80.0–100.0)
Platelets: 279 10*3/uL (ref 150–400)
RBC: 5.2 MIL/uL (ref 4.22–5.81)
RDW: 13.2 % (ref 11.5–15.5)
WBC: 13.8 10*3/uL — ABNORMAL HIGH (ref 4.0–10.5)
nRBC: 0 % (ref 0.0–0.2)

## 2018-09-25 LAB — MRSA PCR SCREENING: MRSA by PCR: NEGATIVE

## 2018-09-25 MED ORDER — NICOTINE 14 MG/24HR TD PT24
14.0000 mg | MEDICATED_PATCH | Freq: Every day | TRANSDERMAL | Status: DC
  Administered 2018-09-25 – 2018-09-26 (×2): 14 mg via TRANSDERMAL
  Filled 2018-09-25 (×2): qty 1

## 2018-09-25 MED ORDER — SODIUM CHLORIDE 0.9 % IV SOLN
1.0000 g | INTRAVENOUS | Status: DC
  Administered 2018-09-25 – 2018-09-26 (×2): 1 g via INTRAVENOUS
  Filled 2018-09-25 (×2): qty 1

## 2018-09-25 MED ORDER — DIPHENHYDRAMINE HCL 25 MG PO CAPS
25.0000 mg | ORAL_CAPSULE | Freq: Four times a day (QID) | ORAL | Status: DC | PRN
  Administered 2018-09-25 (×2): 25 mg via ORAL
  Filled 2018-09-25 (×2): qty 1

## 2018-09-25 MED ORDER — SODIUM CHLORIDE 0.9 % IV SOLN
INTRAVENOUS | Status: DC | PRN
  Administered 2018-09-25: 250 mL via INTRAVENOUS

## 2018-09-25 MED ORDER — VANCOMYCIN HCL 1.25 G IV SOLR
1250.0000 mg | Freq: Two times a day (BID) | INTRAVENOUS | Status: DC
  Filled 2018-09-25 (×2): qty 1250

## 2018-09-25 NOTE — Plan of Care (Signed)

## 2018-09-25 NOTE — Progress Notes (Signed)
  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  Left neck lesion I/D Culture pending Patient Vitals for the past 24 hrs:  BP Temp Temp src Pulse Resp SpO2 Height Weight  09/25/18 1538 (!) 109/59 98.2 F (36.8 C) Oral (!) 52 - 100 % - -  09/25/18 0728 112/66 98.7 F (37.1 C) Oral (!) 58 - 99 % - -  09/25/18 0002 - - - - - - 5\' 9"  (1.753 m) 68.4 kg  09/24/18 2356 124/67 98.2 F (36.8 C) Oral 74 20 100 % - -  09/24/18 2203 119/70 - - 81 20 98 % - -  09/24/18 2006 - - - - - - 5\' 9"  (1.753 m) 65.8 kg  09/24/18 1958 129/72 98.5 F (36.9 C) Oral 100 18 97 % 5\' 9"  (1.753 m) 65.8 kg   CBC Latest Ref Rng & Units 09/25/2018 09/24/2018 04/04/2017  WBC 4.0 - 10.5 K/uL 13.8(H) 15.0(H) 7.4  Hemoglobin 13.0 - 17.0 g/dL 14.2 14.2 13.8  Hematocrit 39.0 - 52.0 % 43.2 44.2 41.1  Platelets 150 - 400 K/uL 279 258 256   Continue Ceftriaxone

## 2018-09-25 NOTE — Progress Notes (Signed)
During shift assessment patient expressed his concern for his care. Patient feels he is not being cared for properly and wants his I&D dressing removed and changed. Patient expresses concern about his antibitotics and states he can just take oral antibiotics. Patient also shares that he is currently supposed to be packing and moving to [place] with his girlfriend and cant follow through because he is here at the hospital. Patient has asked their Probation officer to "call a doctor and get them here". MD paged. Will continue to monitor.

## 2018-09-25 NOTE — Consult Note (Signed)
Pharmacy Antibiotic Note  Bryan Montes Bryan Montes is a 23 y.o. male admitted on 09/24/2018 with left neck abscess/cellulitis.    Pharmacy has been consulted for vancomycin and meropenem dosing.  Plan: 1. Vancomycin 1500mg  IV x 1 dose, followed by Vancomycin 1250 mg IV Q 12 hrs. Goal AUC 400-550. Expected AUC: 457 SCr used: 0.8 Css: 10.5  2. Meropenem 1g IV every 8 hours   Height: 5\' 9"  (175.3 cm) Weight: 150 lb 12.8 oz (68.4 kg) IBW/kg (Calculated) : 70.7  Temp (24hrs), Avg:98.5 F (36.9 C), Min:98.2 F (36.8 C), Max:98.8 F (37.1 C)  Recent Labs  Lab 09/24/18 0633  WBC 15.0*  CREATININE 0.77  LATICACIDVEN 1.5    Estimated Creatinine Clearance: 138.9 mL/min (by C-G formula based on SCr of 0.77 mg/dL).    No Known Allergies  Antimicrobials this admission: 6/24 cefazolin >> x1 6/24 Clindamycin>> x1 6/25 vancomycin >>  6/25 meropenem>>    Microbiology results: 6/24 Neck/wound Cx: pending 6/24 BCx: pending   Thank you for allowing pharmacy to be a part of this patient's care.  Pernell Dupre, PharmD, BCPS Clinical Pharmacist 09/25/2018 12:04 AM

## 2018-09-25 NOTE — Progress Notes (Signed)
[facility] NAME: Bryan Montes    MR#:  [id number]  DATE OF BIRTH:  1995-07-31  SUBJECTIVE: Patient was admitted yesterday morning but he signed out AMA but came back again, readmitted.  Patient has left neck abscess that was drained yesterday morning, admitted and started on antibiotics but he decided to leave so he was given prescription for doxycycline but because he was having worsening pain, his left neck dressing was not looking good so he came back again for admission.  This morning he says his left neck pain is better than yesterday but still tender, no difficulty swallowing, no hypoxia.  CHIEF COMPLAINT:  No chief complaint on file.   REVIEW OF SYSTEMS:   ROS CONSTITUTIONAL: No fever, fatigue or weakness.  EYES: No blurred or double vision.  EARS, NOSE, AND THROAT: No tinnitus or ear pain.  RESPIRATORY: No cough, shortness of breath, wheezing or hemoptysis.  CARDIOVASCULAR: No chest pain, orthopnea, edema.  GASTROINTESTINAL: No nausea, vomiting, diarrhea or abdominal pain.  GENITOURINARY: No dysuria, hematuria.  ENDOCRINE: No polyuria, nocturia,  HEMATOLOGY: No anemia, easy bruising or bleeding SKIN: No rash or lesion. MUSCULOSKELETAL: No joint pain or arthritis.  Left neck pain NEUROLOGIC: No tingling, numbness, weakness.  PSYCHIATRY: No anxiety or depression.   DRUG ALLERGIES:  No Known Allergies  VITALS:  Blood pressure 112/66, pulse (!) 58, temperature 98.7 F (37.1 C), temperature source Oral, resp. rate 20, height 5\' 9"  (1.753 m), weight 68.4 kg, SpO2 99 %.  PHYSICAL EXAMINATION:  GENERAL:  23 y.o.-year-old patient lying in the bed with no acute distress.  EYES: Pupils equal, round, reactive to light and accommodation. No scleral icterus. Extraocular muscles intact.  HEENT: Head atraumatic, normocephalic. Oropharynx and nasopharynx clear.  NECK:  Supple, no jugular venous distention. No thyroid  enlargement, no tenderness.  LUNGS: Normal breath sounds bilaterally, no wheezing, rales,rhonchi or crepitation. No use of accessory muscles of respiration.  CARDIOVASCULAR: S1, S2 normal. No murmurs, rubs, or gallops.  ABDOMEN: Soft, nontender, nondistended. Bowel sounds present. No organomegaly or mass.  EXTREMITIES: No pedal edema, cyanosis, or clubbing.  NEUROLOGIC: Cranial nerves II through XII are intact. Muscle strength 5/5 in all extremities. Sensation intact. Gait not checked.  PSYCHIATRIC: The patient is alert and oriented x 3.  SKIN: No obvious rash, lesion, or ulcer.    LABORATORY PANEL:   CBC Recent Labs  Lab 09/25/18 0529  WBC 13.8*  HGB 14.2  HCT 43.2  PLT 279   ------------------------------------------------------------------------------------------------------------------  Chemistries  Recent Labs  Lab 09/24/18 0633 09/25/18 0529  NA 135 137  K 3.8 3.8  CL 102 107  CO2 25 22  GLUCOSE 94 110*  BUN 11 7  CREATININE 0.77 0.73  CALCIUM 8.5* 8.4*  AST 39  --   ALT 35  --   ALKPHOS 85  --   BILITOT 0.5  --    ------------------------------------------------------------------------------------------------------------------  Cardiac Enzymes No results for input(s): TROPONINI in the last 168 hours. 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 EXAM: CT NECK WITH CONTRAST TECHNIQUE: Multidetector CT imaging of the neck was performed using the standard protocol following the bolus administration of intravenous contrast. CONTRAST:  77mL OMNIPAQUE IOHEXOL 350 MG/ML SOLN COMPARISON:  04/02/2017 FINDINGS: Pharynx and larynx: Mild motion artifact at the larynx. Mild tonsillar hypertrophy appears physiologic. Laryngeal and pharyngeal soft tissue  contours  are within normal limits. Negative parapharyngeal and retropharyngeal spaces. Salivary glands: Sublingual space, submandibular glands, and parotid glands are within normal limits. Thyroid: Negative. Lymph nodes: Along the left posterior neck in the same area which was affected in 2019 there is an irregular rim enhancing multilocular fluid collection encompassing 35 by 20 x 53 millimeters (AP by transverse by CC) with a combination of simple and complex internal fluid. There is a small nearby satellite lesion located slightly posterior and inferior to the dominant collection (series 2, image 50 and coronal image 84). As before, the lesion involves both the subcutaneous layer air and the left trapezius and posterior margin of the left sternocleidomastoid muscles. There is regional cellulitis. No soft tissue gas. Similar reactive appearing left level 2 and level 3 lymphadenopathy. Level 5 is relatively spared. Affected nodes measure up to 10 millimeters short axis. No cystic or necrotic nodes are identified. There is a small residual 9 millimeter oval fluid collection in the contralateral right neck corresponding to the area of a rim enhancing collection in 2019 (series 2, image 44). This does not have an active appearance. Level 1 lymph nodes are within normal limits but right side level 2 lymph nodes are also prominent up to 12-13 millimeter short axis, stable since 2019. Vascular: The major vascular structures in the neck including the left external and internal jugular veins are patent. Major vascular structures at the skull base appear patent. Limited intracranial: Negative. Visualized orbits: Negative. Mastoids and visualized paranasal sinuses: Clear. Skeleton: Negative. No osseous abnormality identified. Upper chest: Negative lung apices, azygos fissure (normal variant). Negative visible superior mediastinum. IMPRESSION: 1. Recurrent left posterior neck abscess, 35 x 20 x 53 mm (estimated volume 19 mL - see  sagittal image 101), involving the subcutaneous layer as well as portions of the left trapezius and sternocleidomastoid muscles. Associated regional cellulitis. No soft tissue gas. 2. Reactive appearing cervical lymphadenopathy. No cystic or necrotic nodes. 3. Small remnant of the small contralateral 2019 right neck abscess (series 2, image 44). Electronically Signed   By: Odessa FlemingH  Hall M.D.   On: 09/24/2018 09:21    EKG:  No orders found for this or any previous visit.  ASSESSMENT AND PLAN:   23 year old male with a recurrent left neck abscess, status post drainage by ER physician yesterday, left AMA but concerned about worsening infection so came back, readmitted again, now on meropenem, I spoke with Dr. Rudene Andaavi Shanker, recommends starting Rocephin, discontinue Vanco and will meropenem, patient WBC down little bit, patient wound culture showed moderate gram-positive negative rods, but gram-positive cocci.  Will wait for ID input to see if patient needs to stay or can be discharged.  Will change antibiotics to Rocephin as per ID recommendation.     All the records are reviewed and case discussed with Care Management/Social Workerr. Management plans discussed with the patient, family and they are in agreement.  CODE STATUS:full  TOTAL TIME TAKING CARE OF THIS PATIENT: 35 minutes.   POSSIBLE D/C IN 1-2 DAYS, DEPENDING ON CLINICAL CONDITION.   Katha HammingSnehalatha Navika Hoopes M.D on 09/25/2018 at 11:57 AM  Between 7am to 6pm - Pager - [phone]  After 6pm go to [email or web address] - password [email or web address]  Fabio Neighborsagle [facility]  Office  [phone]  CC: Primary care physician; Patient, No Pcp Per   Note: This dictation was prepared with Dragon dictation along with smaller phrase technology. Any transcriptional errors that result from this process are unintentional.

## 2018-09-26 LAB — CBC
HCT: 44.8 % (ref 39.0–52.0)
Hemoglobin: 14.4 g/dL (ref 13.0–17.0)
MCH: 27.4 pg (ref 26.0–34.0)
MCHC: 32.1 g/dL (ref 30.0–36.0)
MCV: 85.2 fL (ref 80.0–100.0)
Platelets: 285 10*3/uL (ref 150–400)
RBC: 5.26 MIL/uL (ref 4.22–5.81)
RDW: 13.1 % (ref 11.5–15.5)
WBC: 9.3 10*3/uL (ref 4.0–10.5)
nRBC: 0 % (ref 0.0–0.2)

## 2018-09-26 LAB — CREATININE, SERUM
Creatinine, Ser: 0.79 mg/dL (ref 0.61–1.24)
GFR calc Af Amer: >60 mL/min (ref >60)
GFR calc non Af Amer: >60 mL/min (ref >60)

## 2018-09-26 LAB — HIV ANTIBODY (ROUTINE TESTING W REFLEX): HIV Screen 4th Generation wRfx: NONREACTIVE

## 2018-09-26 MED ORDER — DOXYCYCLINE HYCLATE 50 MG PO CAPS: 100.0000 mg | ORAL_CAPSULE | Freq: Two times a day (BID) | ORAL | 0 refills | Status: DC

## 2018-09-26 NOTE — Progress Notes (Signed)
Discharge instructions and prescription given to pt. IV removed. Dressing changed on left side of neck and educated pt on how to change dressing and how often. No other questions from pt at this time. Pt dressed and will discharge home.

## 2018-09-26 NOTE — Discharge Summary (Signed)
[facility] is a 23 y.o. male  DOB 04/26/1995  MRN [id number].  Admission date:  09/24/2018  Admitting Physician  Oralia Manisavid Willis, MD  Discharge Date:  09/26/2018   Primary MD  Patient, No Pcp Per  Recommendations for primary care physician for things to follow:   Patient is advised to set up appointment as a new patient with dermatology regarding his nodular acne.  Patient has no PCP and he is in the process of moving to [place] this weekend and he mentioned that he will make sure he follows up on that regarding his acne and further treatment.   Admission Diagnosis  Neck abscess [L02.11]   Discharge Diagnosis  Neck abscess [L02.11]   Principal Problem:   Neck abscess Active Problems:   Cellulitis   Abscess, neck      Past Medical History:  Diagnosis Date  . Acute prostatitis     Past Surgical History:  Procedure Laterality Date  . FRACTURE SURGERY         History of present illness and  Hospital Course:     Kindly see H&P for history of present illness and admission details, please review complete Labs, Consult reports and Test reports for all details in brief  HPI  from the history and physical done on the day of admission 23 year old male patient with history of nodular acne, recurrent neck abscesses admitted because of left neck abscess.   Hospital Course   #1 recurrent leg left leg abscess, status post incision and drainage in the emergency room, patient admitted initially on June 24 for IV antibiotics but patient left AGAINST MEDICAL ADVICE and readmitted again due to concern for worsening infection, patient admitted again on Same day evening.  Patient started on meropenem by admitting MD but seen by Dr. Rudene Andaavi Shanker, antibiotics were changed to Rocephin, dressings are changed  on the left leg, cultures from incision and drainage that was done shows gram-positive cocci but full culture results are pending, spoke with Dr. Rudene Andaavi Shanker, as patient has no fever, also WBC normalized patient can be safely discharged with doxycycline 100 mg p.o. twice daily for 1 week, and patient does have nodular acne and needs to see a dermatologist regarding his acne and they are causing recurrent neck abscesses,, concern for nodulocystic acne versus hidradenitis Suppurativa.   Discharge Condition: Stable   Follow UP      Discharge Instructions  and  Discharge Medications      Allergies as of 09/26/2018   No Known Allergies     Medication List    TAKE these medications   doxycycline 50 MG capsule Commonly known as: VIBRAMYCIN Take 2 capsules (100 mg total) by mouth 2 (two) times daily. Take doxycycline 100 mg p.o. twice daily for 1 week, patient to follow-up on cultures that are done with abscess drainage from the left neck.         Diet and Activity recommendation: See Discharge Instructions above   Consults obtained -ID   Major procedures and Radiology Reports - PLEASE review detailed and final reports for all details, in brief -     Ct Soft Tissue Neck W Contrast  Result Date: 09/24/2018 CLINICAL DATA:  23 year old male with history of recurrent neck abscesses. EXAM: CT NECK WITH CONTRAST TECHNIQUE: Multidetector CT imaging of the neck was performed using the standard protocol following the bolus administration of intravenous contrast. CONTRAST:  75mL OMNIPAQUE IOHEXOL 350 MG/ML SOLN COMPARISON:  04/02/2017 FINDINGS: Pharynx and  larynx: Mild motion artifact at the larynx. Mild tonsillar hypertrophy appears physiologic. Laryngeal and pharyngeal soft tissue contours are within normal limits. Negative parapharyngeal and retropharyngeal spaces. Salivary glands: Sublingual space, submandibular glands, and parotid glands are within normal limits. Thyroid: Negative.  Lymph nodes: Along the left posterior neck in the same area which was affected in 2019 there is an irregular rim enhancing multilocular fluid collection encompassing 35 by 20 x 53 millimeters (AP by transverse by CC) with a combination of simple and complex internal fluid. There is a small nearby satellite lesion located slightly posterior and inferior to the dominant collection (series 2, image 50 and coronal image 84). As before, the lesion involves both the subcutaneous layer air and the left trapezius and posterior margin of the left sternocleidomastoid muscles. There is regional cellulitis. No soft tissue gas. Similar reactive appearing left level 2 and level 3 lymphadenopathy. Level 5 is relatively spared. Affected nodes measure up to 10 millimeters short axis. No cystic or necrotic nodes are identified. There is a small residual 9 millimeter oval fluid collection in the contralateral right neck corresponding to the area of a rim enhancing collection in 2019 (series 2, image 44). This does not have an active appearance. Level 1 lymph nodes are within normal limits but right side level 2 lymph nodes are also prominent up to 12-13 millimeter short axis, stable since 2019. Vascular: The major vascular structures in the neck including the left external and internal jugular veins are patent. Major vascular structures at the skull base appear patent. Limited intracranial: Negative. Visualized orbits: Negative. Mastoids and visualized paranasal sinuses: Clear. Skeleton: Negative. No osseous abnormality identified. Upper chest: Negative lung apices, azygos fissure (normal variant). Negative visible superior mediastinum. IMPRESSION: 1. 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 Associated regional cellulitis. No soft tissue gas. 2. Reactive appearing  cervical lymphadenopathy. No cystic or necrotic nodes. 3. Small remnant of the small contralateral 2019 right neck abscess (series 2, image 44). Electronically Signed   By: Odessa FlemingH  Hall M.D.   On: 09/24/2018 09:21    Micro Results     Recent Results (from the past 240 hour(s))  Culture, blood (routine x 2)     Status: None (Preliminary result)   Collection Time: 09/24/18  8:15 AM   Specimen: BLOOD  Result Value Ref Range Status   Specimen Description   Final    BLOOD RIGHT ANTECUBITAL Blood Culture results may not be optimal due to an excessive volume of blood received in culture bottles   Special Requests NONE  Final   Culture   Final    NO GROWTH 2 DAYS Performed at [facility], [address] [place], [place] [address]    Report Status PENDING  Incomplete  Culture, blood (routine x 2)     Status: None (Preliminary result)   Collection Time: 09/24/18  8:20 AM   Specimen: BLOOD  Result Value Ref Range Status   Specimen Description   Final    BLOOD BLOOD LEFT HAND Blood Culture results may not be optimal due to an excessive volume of blood received in culture bottles   Special Requests NONE  Final   Culture   Final    NO GROWTH 2 DAYS Performed at [facility], [address] [place], [place] [address]    Report Status  PENDING  Incomplete  Wound or Superficial Culture     Status: None (Preliminary result)   Collection Time: 09/24/18 10:25 AM   Specimen: Wound  Result Value Ref Range Status   Specimen Description   Final    WOUND LEFT NECK Performed at [facility], [address] [place], [place] [address]    Special Requests   Final    Normal Performed at [facility], [address] [place], [place] [address]    Gram Stain   Final    FEW WBC PRESENT, PREDOMINANTLY PMN MODERATE GRAM NEGATIVE RODS MODERATE GRAM POSITIVE COCCI    Culture   Final    CULTURE REINCUBATED FOR BETTER GROWTH Performed at [facility], [address] [place], [place] [address]    Report Status PENDING  Incomplete  SARS Coronavirus 2 (CEPHEID - Performed in [facility] lab), Hosp Order     Status: None   Collection Time: 09/24/18 11:28 AM   Specimen: Nasopharyngeal Swab  Result Value Ref Range Status   SARS Coronavirus 2 NEGATIVE NEGATIVE Final    Comment: (NOTE) If result is NEGATIVE SARS-CoV-2 target nucleic acids are NOT DETECTED. The SARS-CoV-2 RNA is generally detectable in upper and lower  respiratory specimens during the acute phase of infection. The lowest  concentration of SARS-CoV-2 viral copies this assay can detect is 250  copies / mL. A negative result does not preclude SARS-CoV-2 infection  and should not be used as the sole basis for treatment or other  patient management decisions.  A negative result may occur with  improper specimen collection / handling, submission of specimen other  than nasopharyngeal swab, presence of viral mutation(s) within the  areas targeted by this assay, and inadequate number of viral copies  (<250 copies / mL). A negative result must be combined with clinical  observations, patient history, and epidemiological information. If result is POSITIVE SARS-CoV-2 target nucleic acids are DETECTED. The SARS-CoV-2 RNA is generally detectable in upper and lower  respiratory specimens dur ing the acute phase of infection.  Positive  results are indicative of active infection with SARS-CoV-2.  Clinical  correlation with patient history and other diagnostic information is  necessary to determine patient infection status.  Positive results do  not rule out bacterial infection or co-infection with other viruses. If result is PRESUMPTIVE POSTIVE SARS-CoV-2 nucleic acids MAY BE PRESENT.   A presumptive positive result was obtained on the submitted specimen  and confirmed on repeat testing.  While 2019 novel coronavirus  (SARS-CoV-2) nucleic acids may be present in the submitted sample   additional confirmatory testing may be necessary for epidemiological  and / or clinical management purposes  to differentiate between  SARS-CoV-2 and other Sarbecovirus currently known to infect humans.  If clinically indicated additional testing with an alternate test  methodology [phone]([id number]) is advised. The SARS-CoV-2 RNA is generally  detectable in upper and lower respiratory sp ecimens during the acute  phase of infection. The expected result is Negative. Fact Sheet for Patients:  BoilerBrush.com.cyhttps://www.fda.gov/media/136312/download Fact Sheet for Healthcare Providers: https://pope.com/https://www.fda.gov/media/136313/download This test is not yet approved or cleared by the [place] FDA and has been authorized for detection and/or diagnosis of SARS-CoV-2 by FDA under an Emergency Use Authorization (EUA).  This EUA will remain in effect (meaning this test can be used) for the duration of the COVID-19 declaration under Section 564(b)(1) of the Act, 21 U.S.C. section 360bbb-3(b)(1), unless the authorization is terminated or revoked  sooner. Performed at [facility], [address] [place], [place] [address]   MRSA PCR Screening     Status: None   Collection Time: 09/25/18  1:19 AM   Specimen: Nasopharyngeal  Result Value Ref Range Status   MRSA by PCR NEGATIVE NEGATIVE Final    Comment:        The GeneXpert MRSA Assay (FDA approved for NASAL specimens only), is one component of a comprehensive MRSA colonization surveillance program. It is not intended to diagnose MRSA infection nor to guide or monitor treatment for MRSA infections. Performed at [facility], [address] [place], [place] [address]        Today   Subjective:   Bryan Montes today has no headache,no chest abdominal pain,no new weakness tingling or numbness, feels much better wants to go home today.   Objective:   Blood pressure 110/60, pulse (!) 52, temperature 97.8 F (36.6 C),  temperature source Oral, resp. rate 16, height 5\' 9"  (1.753 m), weight 68.4 kg, SpO2 100 %.   Intake/Output Summary (Last 24 hours) at 09/26/2018 1203 Last data filed at 09/25/2018 1505 Gross per 24 hour  Intake 126.11 ml  Output -  Net 126.11 ml    Exam Awake Alert, Oriented x 3, No new F.N deficits, Normal affect [place].AT,PERRAL Dressing present on the left and neck.  Symmetrical Chest wall movement, Good air movement bilaterally, CTAB RRR,No Gallops,Rubs or new Murmurs, No Parasternal Heave +ve B.Sounds, Abd Soft, Non tender, No organomegaly appriciated, No rebound -guarding or rigidity. No Cyanosis, Clubbing or edema, No new Rash or bruise  Data Review   CBC w Diff:  Lab Results  Component Value Date   WBC 9.3 09/26/2018   HGB 14.4 09/26/2018   HCT 44.8 09/26/2018   PLT 285 09/26/2018   LYMPHOPCT 15 09/24/2018   MONOPCT 6 09/24/2018   EOSPCT 4 09/24/2018   BASOPCT 1 09/24/2018    CMP:  Lab Results  Component Value Date   NA 137 09/25/2018   K 3.8 09/25/2018   CL 107 09/25/2018   CO2 22 09/25/2018   BUN 7 09/25/2018   CREATININE 0.79 09/26/2018   PROT 7.8 09/24/2018   ALBUMIN 4.1 09/24/2018   BILITOT 0.5 09/24/2018   ALKPHOS 85 09/24/2018   AST 39 09/24/2018   ALT 35 09/24/2018  .   Total Time in preparing paper work, data evaluation and todays exam - 35 minutes  Epifanio Lesches M.D on 09/26/2018 at 12:03 PM    Note: This dictation was prepared with Dragon dictation along with smaller phrase technology. Any transcriptional errors that result from this process are unintentional.

## 2018-09-26 NOTE — Progress Notes (Signed)
Dressing present on the left neck, less tender, no erythema noted.  Discussed with registered nurse Janett Billow to remove the bandage and see how it is his patient does not want to go home with packing of the wound.

## 2018-09-28 LAB — AEROBIC CULTURE W GRAM STAIN (SUPERFICIAL SPECIMEN): Special Requests: NORMAL

## 2018-09-29 LAB — CULTURE, BLOOD (ROUTINE X 2)
Culture: NO GROWTH
Culture: NO GROWTH

## 2019-07-24 ENCOUNTER — Emergency Department
Admission: EM | Admit: 2019-07-24 | Discharge: 2019-07-24 | Disposition: A | Discharge status: Home / Self Care | Payer: Self-pay | Attending: Emergency Medicine | Admitting: Emergency Medicine

## 2019-07-24 ENCOUNTER — Other Ambulatory Visit: Payer: Self-pay

## 2019-07-24 ENCOUNTER — Emergency Department: Payer: Self-pay

## 2019-07-24 ENCOUNTER — Encounter: Payer: Self-pay | Admitting: *Deleted

## 2019-07-24 DIAGNOSIS — F4325 Adjustment disorder with mixed disturbance of emotions and conduct: Secondary | ICD-10-CM | POA: Diagnosis present

## 2019-07-24 DIAGNOSIS — Y93I9 Activity, other involving external motion: Secondary | ICD-10-CM | POA: Insufficient documentation

## 2019-07-24 DIAGNOSIS — Z20822 Contact with and (suspected) exposure to covid-19: Secondary | ICD-10-CM | POA: Insufficient documentation

## 2019-07-24 DIAGNOSIS — F331 Major depressive disorder, recurrent, moderate: Secondary | ICD-10-CM | POA: Insufficient documentation

## 2019-07-24 DIAGNOSIS — Y92410 Unspecified street and highway as the place of occurrence of the external cause: Secondary | ICD-10-CM | POA: Insufficient documentation

## 2019-07-24 DIAGNOSIS — Z79899 Other long term (current) drug therapy: Secondary | ICD-10-CM | POA: Insufficient documentation

## 2019-07-24 DIAGNOSIS — R519 Headache, unspecified: Secondary | ICD-10-CM | POA: Insufficient documentation

## 2019-07-24 DIAGNOSIS — Y999 Unspecified external cause status: Secondary | ICD-10-CM | POA: Insufficient documentation

## 2019-07-24 DIAGNOSIS — F17210 Nicotine dependence, cigarettes, uncomplicated: Secondary | ICD-10-CM | POA: Insufficient documentation

## 2019-07-24 LAB — COMPREHENSIVE METABOLIC PANEL
ALT: 35 U/L (ref 0–44)
AST: 33 U/L (ref 15–41)
Albumin: 4.4 g/dL (ref 3.5–5.0)
Alkaline Phosphatase: 88 U/L (ref 38–126)
Anion gap: 8 (ref 5–15)
BUN: 10 mg/dL (ref 6–20)
CO2: 27 mmol/L (ref 22–32)
Calcium: 8.7 mg/dL — ABNORMAL LOW (ref 8.9–10.3)
Chloride: 104 mmol/L (ref 98–111)
Creatinine, Ser: 0.77 mg/dL (ref 0.61–1.24)
GFR calc Af Amer: >60 mL/min (ref >60)
GFR calc non Af Amer: >60 mL/min (ref >60)
Glucose, Bld: 102 mg/dL — ABNORMAL HIGH (ref 70–99)
Potassium: 4.3 mmol/L (ref 3.5–5.1)
Sodium: 139 mmol/L (ref 135–145)
Total Bilirubin: 0.4 mg/dL (ref 0.3–1.2)
Total Protein: 8.2 g/dL — ABNORMAL HIGH (ref 6.5–8.1)

## 2019-07-24 LAB — CBC
HCT: 45.2 % (ref 39.0–52.0)
Hemoglobin: 14.7 g/dL (ref 13.0–17.0)
MCH: 27.6 pg (ref 26.0–34.0)
MCHC: 32.5 g/dL (ref 30.0–36.0)
MCV: 84.8 fL (ref 80.0–100.0)
Platelets: 354 10*3/uL (ref 150–400)
RBC: 5.33 MIL/uL (ref 4.22–5.81)
RDW: 14.1 % (ref 11.5–15.5)
WBC: 16.1 10*3/uL — ABNORMAL HIGH (ref 4.0–10.5)
nRBC: 0 % (ref 0.0–0.2)

## 2019-07-24 LAB — RESPIRATORY PANEL BY RT PCR (FLU A&B, COVID)
Influenza A by PCR: NEGATIVE
Influenza B by PCR: NEGATIVE
SARS Coronavirus 2 by RT PCR: NEGATIVE

## 2019-07-24 LAB — ETHANOL: Alcohol, Ethyl (B): 69 mg/dL — ABNORMAL HIGH (ref <10)

## 2019-07-24 LAB — SALICYLATE LEVEL: Salicylate Lvl: <7 mg/dL — ABNORMAL LOW (ref 7.0–30.0)

## 2019-07-24 LAB — ACETAMINOPHEN LEVEL: Acetaminophen (Tylenol), Serum: <10 ug/mL — ABNORMAL LOW (ref 10–30)
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
## 2019-07-24 NOTE — ED Notes (Signed)
Pt discharged home. VS stable. Pt denies SI.  Belongings returned to pt (including phone, Academic librarian).  Discharge instructions reviewed with pt. Pt signed for discharge.
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
## 2019-07-24 NOTE — ED Notes (Signed)
IVC PAPERS  RESCINDED  PER  DR  TANNER  MD  INFORMED  AMY  B 

## 2019-07-24 NOTE — ED Notes (Signed)
Hourly rounding reveals patient in hallway bed sitting on bed. No complaints, stable, in no acute distress. Q15 minute rounds and monitoring via Rover and Officer to continue. 
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
## 2019-07-24 NOTE — ED Notes (Signed)
Pt stating he wants to leave because he has "things to do" and it is his mother's birthday.  Pt told he must speak with the psychiatrist.

## 2019-07-24 NOTE — ED Notes (Signed)
IVC PAPERS  RESCINDED  PER  DR  Burgess Estelle  MD  INFORMED  Bryan Montes

## 2019-07-24 NOTE — ED Notes (Signed)
Patient asked when he was being discharged, writer informed him that he needs to speak with the psychiatry team. Patient said ok
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
## 2019-10-20 ENCOUNTER — Telehealth: Payer: Self-pay | Admitting: General Practice

## 2019-10-20 NOTE — Telephone Encounter (Signed)
Individual has been contacted 3+ times regarding ED referral. No further attempts to contact individual will be made. 

## 2020-02-13 DIAGNOSIS — Z87891 Personal history of nicotine dependence: Secondary | ICD-10-CM | POA: Insufficient documentation

## 2020-02-13 DIAGNOSIS — L03221 Cellulitis of neck: Secondary | ICD-10-CM | POA: Insufficient documentation

## 2020-02-13 NOTE — ED Triage Notes (Signed)
FIRST NURSE NOTE: Pt reports abscess to right side of neck x 6 days.

## 2020-02-14 ENCOUNTER — Emergency Department
Admission: EM | Admit: 2020-02-14 | Discharge: 2020-02-14 | Disposition: A | Discharge status: Home / Self Care | Payer: Medicaid Other | Attending: Emergency Medicine | Admitting: Emergency Medicine

## 2020-02-14 ENCOUNTER — Encounter: Payer: Self-pay | Admitting: Emergency Medicine

## 2020-02-14 ENCOUNTER — Other Ambulatory Visit: Payer: Self-pay

## 2020-02-14 ENCOUNTER — Emergency Department: Payer: Medicaid Other

## 2020-02-14 DIAGNOSIS — L03221 Cellulitis of neck: Secondary | ICD-10-CM

## 2020-02-14 LAB — CBC WITH DIFFERENTIAL/PLATELET
Abs Immature Granulocytes: 0.05 10*3/uL (ref 0.00–0.07)
Basophils Absolute: 0.1 10*3/uL (ref 0.0–0.1)
Basophils Relative: 1 %
Eosinophils Absolute: 0.4 10*3/uL (ref 0.0–0.5)
Eosinophils Relative: 3 %
HCT: 38.8 % — ABNORMAL LOW (ref 39.0–52.0)
Hemoglobin: 12.5 g/dL — ABNORMAL LOW (ref 13.0–17.0)
Immature Granulocytes: 0 %
Lymphocytes Relative: 24 %
Lymphs Abs: 2.9 10*3/uL (ref 0.7–4.0)
MCH: 26.2 pg (ref 26.0–34.0)
MCHC: 32.2 g/dL (ref 30.0–36.0)
MCV: 81.2 fL (ref 80.0–100.0)
Monocytes Absolute: 0.8 10*3/uL (ref 0.1–1.0)
Monocytes Relative: 7 %
Neutro Abs: 7.9 10*3/uL — ABNORMAL HIGH (ref 1.7–7.7)
Neutrophils Relative %: 65 %
Platelets: 305 10*3/uL (ref 150–400)
RBC: 4.78 MIL/uL (ref 4.22–5.81)
RDW: 13.6 % (ref 11.5–15.5)
WBC: 12.1 10*3/uL — ABNORMAL HIGH (ref 4.0–10.5)
nRBC: 0 % (ref 0.0–0.2)

## 2020-02-14 LAB — BASIC METABOLIC PANEL
Anion gap: 7 (ref 5–15)
BUN: 10 mg/dL (ref 6–20)
CO2: 24 mmol/L (ref 22–32)
Calcium: 8.8 mg/dL — ABNORMAL LOW (ref 8.9–10.3)
Chloride: 102 mmol/L (ref 98–111)
Creatinine, Ser: 0.75 mg/dL (ref 0.61–1.24)
GFR, Estimated: >60 mL/min (ref >60)
Glucose, Bld: 105 mg/dL — ABNORMAL HIGH (ref 70–99)
Potassium: 3.5 mmol/L (ref 3.5–5.1)
Sodium: 133 mmol/L — ABNORMAL LOW (ref 135–145)

## 2020-02-14 MED ORDER — CEPHALEXIN 500 MG PO CAPS: 500.0000 mg | ORAL_CAPSULE | Freq: Four times a day (QID) | ORAL | 0 refills | Status: AC

## 2020-02-14 MED ORDER — CEPHALEXIN 500 MG PO CAPS
500.0000 mg | ORAL_CAPSULE | Freq: Once | ORAL | Status: AC
  Administered 2020-02-14: 500 mg via ORAL
  Filled 2020-02-14: qty 1

## 2020-02-14 MED ORDER — SULFAMETHOXAZOLE-TRIMETHOPRIM 800-160 MG PO TABS
1.0000 | ORAL_TABLET | Freq: Once | ORAL | Status: AC
  Administered 2020-02-14: 1 via ORAL
  Filled 2020-02-14: qty 1

## 2020-02-14 MED ORDER — LIDOCAINE HCL (PF) 1 % IJ SOLN
10.0000 mL | Freq: Once | INTRAMUSCULAR | Status: DC
  Filled 2020-02-14: qty 10

## 2020-02-14 MED ORDER — IOHEXOL 300 MG/ML  SOLN
75.0000 mL | Freq: Once | INTRAMUSCULAR | Status: AC | PRN
  Administered 2020-02-14: 75 mL via INTRAVENOUS

## 2020-02-14 MED ORDER — SULFAMETHOXAZOLE-TRIMETHOPRIM 800-160 MG PO TABS
1.0000 | ORAL_TABLET | Freq: Two times a day (BID) | ORAL | 0 refills | Status: AC
Start: 2020-02-14 — End: 2020-02-21

## 2020-02-14 NOTE — ED Triage Notes (Signed)
Patient with abscess to right side of his neck time one week. Patient states that he has had similar abscess in the past that had to be drained.

## 2020-02-14 NOTE — ED Provider Notes (Signed)
[facility] Emergency Department Provider Note   ____________________________________________   First MD Initiated Contact with Patient 02/14/20 [phone]     (approximate)  I have reviewed the triage vital signs and the nursing notes.   HISTORY  Chief Complaint Abscess    HPI Bryan Montes is a 24 y.o. male with past medical history of recurrent abscesses who presents to the ED complaining of neck abscess.  Patient reports that for the past 4 days he has noticed redness, swelling, and pain to the right side of his neck.  He has dealt with abscesses on multiple occasions in the past and current episode of pain feels similar.  He has been applying warm compresses, but has not had any drainage from the abscess.  He now feels like it requires incision and drainage.  He denies any fevers, nausea, vomiting.  He has not recently seen a specialist for these recurrent abscesses.        Past Medical History:  Diagnosis Date  . Acute prostatitis     Patient Active Problem List   Diagnosis Date Noted  . Adjustment disorder with mixed disturbance of emotions and conduct   . Cellulitis 09/24/2018  . Abscess, neck 09/24/2018  . Neck abscess 04/02/2017    Past Surgical History:  Procedure Laterality Date  . FRACTURE SURGERY      Prior to Admission medications   Medication Sig Start Date End Date Taking? Authorizing Provider  cephALEXin (KEFLEX) 500 MG capsule Take 1 capsule (500 mg total) by mouth 4 (four) times daily for 7 days. 02/14/20 02/21/20  Chesley Noon, MD  doxycycline (VIBRAMYCIN) 50 MG capsule Take 2 capsules (100 mg total) by mouth 2 (two) times daily. Take doxycycline 100 mg p.o. twice daily for 1 week, patient to follow-up on cultures that are done with abscess drainage from the left neck. Patient not taking: Reported on 07/24/2019 09/26/18   Katha Hamming, MD  sulfamethoxazole-trimethoprim (BACTRIM DS) 800-160 MG tablet Take  1 tablet by mouth 2 (two) times daily for 7 days. 02/14/20 02/21/20  Chesley Noon, MD    Allergies Patient has no known allergies.  Family History  Problem Relation Age of Onset  . Breast cancer Maternal Grandmother     Social History Social History   Tobacco Use  . Smoking status: Former Smoker    Packs/day: 0.25    Years: 7.00    Pack years: 1.75    Types: Cigars, Cigarettes  . Smokeless tobacco: Never Used  Vaping Use  . Vaping Use: Never used  Substance Use Topics  . Alcohol use: Not Currently  . Drug use: No    Review of Systems  Constitutional: No fever/chills Eyes: No visual changes. ENT: No sore throat.  Positive for neck pain and swelling. Cardiovascular: Denies chest pain. Respiratory: Denies shortness of breath. Gastrointestinal: No abdominal pain.  No nausea, no vomiting.  No diarrhea.  No constipation. Genitourinary: Negative for dysuria. Musculoskeletal: Negative for back pain. Skin: Negative for rash. Neurological: Negative for headaches, focal weakness or numbness.  ____________________________________________   PHYSICAL EXAM:  VITAL SIGNS: ED Triage Vitals [02/14/20 0010]  Enc Vitals Group     BP 120/84     Pulse Rate 87     Resp 18     Temp 99.3 F (37.4 C)     Temp Source Oral     SpO2 100 %     Weight 106 lb (48.1 kg)     Height 6' (1.829  m)     Head Circumference      Peak Flow      Pain Score 9     Pain Loc      Pain Edu?      Excl. in GC?     Constitutional: Alert and oriented. Eyes: Conjunctivae are normal. Head: Atraumatic. Nose: No congestion/rhinnorhea. Mouth/Throat: Mucous membranes are moist. Neck: Normal ROM.  Erythema and warmth noted over right neck central induration and fluctuance. Cardiovascular: Normal rate, regular rhythm. Grossly normal heart sounds. Respiratory: Normal respiratory effort.  No retractions. Lungs CTAB. Gastrointestinal: Soft and nontender. No distention. Genitourinary:  deferred Musculoskeletal: No lower extremity tenderness nor edema. Neurologic:  Normal speech and language. No gross focal neurologic deficits are appreciated. Skin:  Skin is warm, dry and intact. No rash noted. Psychiatric: Mood and affect are normal. Speech and behavior are normal.  ____________________________________________   LABS (all labs ordered are listed, but only abnormal results are displayed)  Labs Reviewed  CBC WITH DIFFERENTIAL/PLATELET - Abnormal; Notable for the following components:      Result Value   WBC 12.1 (*)    Hemoglobin 12.5 (*)    HCT 38.8 (*)    Neutro Abs 7.9 (*)    All other components within normal limits  BASIC METABOLIC PANEL - Abnormal; Notable for the following components:   Sodium 133 (*)    Glucose, Bld 105 (*)    Calcium 8.8 (*)    All other components within normal limits     PROCEDURES  Procedure(s) performed (including Critical Care):  Procedures   ____________________________________________   INITIAL IMPRESSION / ASSESSMENT AND PLAN / ED COURSE       24 year old male with past medical history of recurrent abscesses presents to the ED with 4 days of pain and swelling to the right side of his neck.  Vital signs are reassuring and patient does not appear systemically ill.  He does have what appears to be an abscess with associated cellulitis to the right side of his neck.  We will obtain CT scan to ensure it does not affect deeper structures.  He declines pain medication at this time.  Lab work is unremarkable.  CT scan shows findings consistent with nodular acne along with cellulitis extending along the right side of his neck.  There is a very small fluid collection in this area that could represent abscess, however given it is only 9 mm, holding off on incision and drainage of his neck seems reasonable at this time.  We will start patient on Keflex and Bactrim, he was counseled to continue warm compresses.  He was provided with  follow-up information for ENT as well as dermatology.  He was counseled to return to the ED for new or worsening symptoms, patient agrees with plan.      ____________________________________________   FINAL CLINICAL IMPRESSION(S) / ED DIAGNOSES  Final diagnoses:  Cellulitis of neck     ED Discharge Orders         Ordered    cephALEXin (KEFLEX) 500 MG capsule  4 times daily        02/14/20 0645    sulfamethoxazole-trimethoprim (BACTRIM DS) 800-160 MG tablet  2 times daily        02/14/20 0645           Note:  This document was prepared using Dragon voice recognition software and may include unintentional dictation errors.   Chesley Noon, MD 02/14/20 [phone]

## 2020-02-16 ENCOUNTER — Other Ambulatory Visit: Payer: Self-pay

## 2020-02-16 ENCOUNTER — Encounter: Payer: Self-pay | Admitting: *Deleted

## 2020-02-16 DIAGNOSIS — Z87891 Personal history of nicotine dependence: Secondary | ICD-10-CM | POA: Insufficient documentation

## 2020-02-16 DIAGNOSIS — L0211 Cutaneous abscess of neck: Secondary | ICD-10-CM | POA: Insufficient documentation

## 2020-02-16 NOTE — ED Triage Notes (Signed)
Pt has abscess to right side of neck for 6 days.  Area red and tender.  No drainage.  Pt on keflex and bactrim.  Pt alert.

## 2020-02-17 ENCOUNTER — Emergency Department
Admission: EM | Admit: 2020-02-17 | Discharge: 2020-02-17 | Disposition: A | Discharge status: Home / Self Care | Payer: Medicaid Other | Attending: Emergency Medicine | Admitting: Emergency Medicine

## 2020-02-17 DIAGNOSIS — L0291 Cutaneous abscess, unspecified: Secondary | ICD-10-CM

## 2020-02-17 MED ORDER — LIDOCAINE HCL (PF) 1 % IJ SOLN
5.0000 mL | Freq: Once | INTRAMUSCULAR | Status: AC
  Administered 2020-02-17: 5 mL
  Filled 2020-02-17: qty 5

## 2020-02-17 NOTE — Discharge Instructions (Addendum)
Please seek medical attention for any high fevers, chest pain, shortness of breath, change in behavior, persistent vomiting, bloody stool or any other new or concerning symptoms.  

## 2020-02-17 NOTE — ED Provider Notes (Signed)
[facility] Emergency Department Provider Note   ____________________________________________   I have reviewed the triage vital signs and the nursing notes.   HISTORY  Chief Complaint Abscess   History limited by: Not Limited   HPI [facility] Mohamad Bruso is a 24 y.o. male who presents to the emergency department today because of concern for an abscess to the right side of his neck. The patient was seen in the ER 3 days ago for similar symptoms. States he has been taking the antibiotics as prescribed however it has increased and size and has continued to be painful. He has had similar issues in the past that have required incision and drainage. The patient denies any fevers. No nausea or vomiting.     Records reviewed. Per medical record review patient has a history of abscesses.   Past Medical History:  Diagnosis Date  . Acute prostatitis     Patient Active Problem List   Diagnosis Date Noted  . Adjustment disorder with mixed disturbance of emotions and conduct   . Cellulitis 09/24/2018  . Abscess, neck 09/24/2018  . Neck abscess 04/02/2017    Past Surgical History:  Procedure Laterality Date  . FRACTURE SURGERY      Prior to Admission medications   Medication Sig Start Date End Date Taking? Authorizing Provider  cephALEXin (KEFLEX) 500 MG capsule Take 1 capsule (500 mg total) by mouth 4 (four) times daily for 7 days. 02/14/20 02/21/20  Chesley Noon, MD  doxycycline (VIBRAMYCIN) 50 MG capsule Take 2 capsules (100 mg total) by mouth 2 (two) times daily. Take doxycycline 100 mg p.o. twice daily for 1 week, patient to follow-up on cultures that are done with abscess drainage from the left neck. Patient not taking: Reported on 07/24/2019 09/26/18   Katha Hamming, MD  sulfamethoxazole-trimethoprim (BACTRIM DS) 800-160 MG tablet Take 1 tablet by mouth 2 (two) times daily for 7 days. 02/14/20 02/21/20  Chesley Noon, MD     Allergies Patient has no known allergies.  Family History  Problem Relation Age of Onset  . Breast cancer Maternal Grandmother     Social History Social History   Tobacco Use  . Smoking status: Former Smoker    Packs/day: 0.25    Years: 7.00    Pack years: 1.75    Types: Cigars, Cigarettes  . Smokeless tobacco: Never Used  Vaping Use  . Vaping Use: Never used  Substance Use Topics  . Alcohol use: Not Currently  . Drug use: No    Review of Systems Constitutional: No fever/chills Eyes: No visual changes. ENT: No sore throat. Cardiovascular: Denies chest pain. Respiratory: Denies shortness of breath. Gastrointestinal: No abdominal pain.  No nausea, no vomiting.  No diarrhea.   Genitourinary: Negative for dysuria. Musculoskeletal: Negative for back pain. Skin: Positive for abscess to right neck. Neurological: Negative for headaches, focal weakness or numbness.  ____________________________________________   PHYSICAL EXAM:  VITAL SIGNS: ED Triage Vitals [02/16/20 2323]  Enc Vitals Group     BP 136/69     Pulse Rate 99     Resp 18     Temp 99.9 F (37.7 C)     Temp Source Oral     SpO2 99 %     Weight 160 lb (72.6 kg)     Height 6' (1.829 m)     Head Circumference      Peak Flow      Pain Score 9   Constitutional: Alert and oriented.  Eyes:  Conjunctivae are normal.  ENT      Head: Normocephalic and atraumatic.      Nose: No congestion/rhinnorhea.      Mouth/Throat: Mucous membranes are moist.      Neck: No stridor. Hematological/Lymphatic/Immunilogical: No cervical lymphadenopathy. Cardiovascular: Normal rate, regular rhythm.  No murmurs, rubs, or gallops.  Respiratory: Normal respiratory effort without tachypnea nor retractions. Breath sounds are clear and equal bilaterally. No wheezes/rales/rhonchi. Gastrointestinal: Soft and non tender. No rebound. No guarding.  Genitourinary: Deferred Musculoskeletal: Normal range of motion in all extremities.  No lower extremity edema. Neurologic:  Normal speech and language. No gross focal neurologic deficits are appreciated.  Skin:  Fluctuant raised area roughly 2 cm in diameter to the right neck consistent with abscess. Psychiatric: Mood and affect are normal. Speech and behavior are normal. Patient exhibits appropriate insight and judgment.  ____________________________________________    LABS (pertinent positives/negatives)  None  ____________________________________________   EKG  None  ____________________________________________    RADIOLOGY  None  ____________________________________________   PROCEDURES  Procedures  Incision and Drainage of Abscess Location: right neck Anesthesia Local: 1% Lidocaine with Epi  Prep/Procedure: Skin Prep: Chlorahex Incised abscess with #11 blade Purulent discharge: large Probed to break up loculations Packed with 1/4" gauze Estimated blood loss: 2 ml  ____________________________________________   INITIAL IMPRESSION / ASSESSMENT AND PLAN / ED COURSE  Pertinent labs & imaging results that were available during my care of the patient were reviewed by me and considered in my medical decision making (see chart for details).   Patient presented to the emergency department today because of concern for abscess. Exam is consistent with abscess to the right neck. No surrounding erythema or fever. I and D was performed with large amount of pus drained. Did encourage patient to follow up with his PCP and continue antibiotics.  ____________________________________________   FINAL CLINICAL IMPRESSION(S) / ED DIAGNOSES  Final diagnoses:  Abscess     Note: This dictation was prepared with Dragon dictation. Any transcriptional errors that result from this process are unintentional     Phineas Semen, MD 02/17/20 [phone]

## 2023-06-10 ENCOUNTER — Other Ambulatory Visit: Payer: Self-pay

## 2023-06-10 ENCOUNTER — Emergency Department
Admission: EM | Admit: 2023-06-10 | Discharge: 2023-06-10 | Disposition: A | Discharge status: Home / Self Care | Payer: Self-pay | Attending: Emergency Medicine | Admitting: Emergency Medicine

## 2023-06-10 DIAGNOSIS — S61012A Laceration without foreign body of left thumb without damage to nail, initial encounter: Secondary | ICD-10-CM | POA: Diagnosis not present

## 2023-06-10 DIAGNOSIS — Z23 Encounter for immunization: Secondary | ICD-10-CM | POA: Diagnosis not present

## 2023-06-10 DIAGNOSIS — R55 Syncope and collapse: Secondary | ICD-10-CM | POA: Diagnosis not present

## 2023-06-10 DIAGNOSIS — W260XXA Contact with knife, initial encounter: Secondary | ICD-10-CM | POA: Insufficient documentation

## 2023-06-10 DIAGNOSIS — S6992XA Unspecified injury of left wrist, hand and finger(s), initial encounter: Secondary | ICD-10-CM | POA: Diagnosis present

## 2023-06-10 MED ORDER — TETANUS-DIPHTH-ACELL PERTUSSIS 5-2.5-18.5 LF-MCG/0.5 IM SUSY
0.5000 mL | PREFILLED_SYRINGE | Freq: Once | INTRAMUSCULAR | Status: AC
  Administered 2023-06-10: 0.5 mL via INTRAMUSCULAR
  Filled 2023-06-10: qty 0.5

## 2023-06-10 NOTE — ED Triage Notes (Signed)
 Pt arrived POV for left thumb laceration that happen a week ago from a bread knife, pt has bandage in place, no bleeding noted. Last tetanus unknown.

## 2023-06-10 NOTE — Discharge Instructions (Addendum)
You may alternate Tylenol 1000 mg every 6 hours as needed for pain, fever and Ibuprofen 800 mg every 6-8 hours as needed for pain, fever.  Please take Ibuprofen with food.  Do not take more than 4000 mg of Tylenol (acetaminophen) in a 24 hour period. ° °

## 2023-06-10 NOTE — ED Provider Notes (Signed)
 [facility] Provider Note    Event Date/Time   First MD Initiated Contact with Patient 06/10/23 0403     (approximate)   History   Laceration   HPI  Bryan Montes is a 28 y.o. male with no significant past medical history who presents to the emergency department with a laceration that occurred to his left thumb a week ago.  He states he was changing the dressing on his thumb when he suddenly got very hot, sweaty, lightheaded and had chest pain and shortness of breath and felt like he was get a pass out.  States this has never happened to him before.  He states he got worried and thought he may need to come to the ER to get a tetanus vaccine.   History provided by patient.    Past Medical History:  Diagnosis Date   Acute prostatitis     Past Surgical History:  Procedure Laterality Date   FRACTURE SURGERY      MEDICATIONS:  Prior to Admission medications   Medication Sig Start Date End Date Taking? Authorizing Provider  doxycycline (VIBRAMYCIN) 50 MG capsule Take 2 capsules (100 mg total) by mouth 2 (two) times daily. Take doxycycline 100 mg p.o. twice daily for 1 week, patient to follow-up on cultures that are done with abscess drainage from the left neck. Patient not taking: Reported on 07/24/2019 09/26/18   Katha Hamming, MD    Physical Exam   Triage Vital Signs: ED Triage Vitals  Encounter Vitals Group     BP 06/10/23 0321 119/72     Systolic BP Percentile --      Diastolic BP Percentile --      Pulse Rate 06/10/23 0321 86     Resp 06/10/23 0321 16     Temp 06/10/23 0321 98.5 F (36.9 C)     Temp Source 06/10/23 0321 Oral     SpO2 06/10/23 0321 100 %     Weight 06/10/23 0315 122 lb (55.3 kg)     Height 06/10/23 0315 5\' 11"  (1.803 m)     Head Circumference --      Peak Flow --      Pain Score 06/10/23 0314 6     Pain Loc --      Pain Education --      Exclude from Growth Chart --     Most recent vital  signs: Vitals:   06/10/23 0321  BP: 119/72  Pulse: 86  Resp: 16  Temp: 98.5 F (36.9 C)  SpO2: 100%    CONSTITUTIONAL: Alert, responds appropriately to questions. Well-appearing; well-nourished HEAD: Normocephalic, atraumatic EYES: Conjunctivae clear, pupils appear equal, sclera nonicteric ENT: normal nose; moist mucous membranes NECK: Supple, normal ROM CARD: RRR; S1 and S2 appreciated RESP: Normal chest excursion without splinting or tachypnea; breath sounds clear and equal bilaterally; no wheezes, no rhonchi, no rales, no hypoxia or respiratory distress, speaking full sentences ABD/GI: Non-distended; soft, non-tender, no rebound, no guarding, no peritoneal signs BACK: The back appears normal EXT: Normal ROM in all joints; no deformity noted, no edema, superficial laceration to the left thumb without surrounding redness, warmth, bleeding or drainage; normal capillary refill, 2+ left radial pulse SKIN: Normal color for age and race; warm; no rash on exposed skin, no calf tenderness or calf swelling NEURO: Moves all extremities equally, normal speech, normal gait PSYCH: The patient's mood and manner are appropriate.   ED Results / Procedures / Treatments   LABS: (  all labs ordered are listed, but only abnormal results are displayed) Labs Reviewed - No data to display   EKG:   RADIOLOGY: My personal review and interpretation of imaging:    I have personally reviewed all radiology reports.   No results found.   PROCEDURES:  Critical Care performed: No    Procedures    IMPRESSION / MDM / ASSESSMENT AND PLAN / ED COURSE  I reviewed the triage vital signs and the nursing notes.    Patient here with near syncopal episode after changing dressing on his right thumb where he has a laceration.    DIFFERENTIAL DIAGNOSIS (includes but not limited to):   Vasovagal near syncope, anemia, electrolyte derangement, less likely ACS, PE.  Doubt dissection.   Patient's  presentation is most consistent with acute presentation with potential threat to life or bodily function.   PLAN: I recommended we obtain EKG, labs, chest x-ray but patient states he is only here because he wants a tetanus vaccine.  His wound is well outside the window where we can safely suture it and it will heal by secondary intention.  There is no sign of superimposed infection today.  He understands that without further workup we cannot rule out life-threatening illness today.  He states he is feeling better and continues to decline even an EKG.  Will update tetanus vaccine.  Discussed wound care instructions.   MEDICATIONS GIVEN IN ED: Medications  Tdap (BOOSTRIX) injection 0.5 mL (0.5 mLs Intramuscular Given 06/10/23 0450)     ED COURSE: Recommended further workup however patient declined stating he is just here for tetanus vaccine.   CONSULTS:  none   OUTSIDE RECORDS REVIEWED: Reviewed prior admission for neck abscess in June 2020.       FINAL CLINICAL IMPRESSION(S) / ED DIAGNOSES   Final diagnoses:  Laceration of left thumb with delay in treatment, initial encounter  Vasovagal near-syncope  Encounter for vaccination     Rx / DC Orders   ED Discharge Orders     None        Note:  This document was prepared using Dragon voice recognition software and may include unintentional dictation errors.   Chesley Valls, Layla Maw, DO 06/10/23 [phone]

## 2023-07-06 ENCOUNTER — Emergency Department
Admission: EM | Admit: 2023-07-06 | Discharge: 2023-07-07 | Disposition: A | Discharge status: Home / Self Care | Payer: Self-pay | Attending: Emergency Medicine | Admitting: Emergency Medicine

## 2023-07-06 ENCOUNTER — Other Ambulatory Visit: Payer: Self-pay

## 2023-07-06 ENCOUNTER — Emergency Department: Payer: Self-pay

## 2023-07-06 DIAGNOSIS — S60221A Contusion of right hand, initial encounter: Secondary | ICD-10-CM | POA: Insufficient documentation

## 2023-07-06 DIAGNOSIS — W228XXA Striking against or struck by other objects, initial encounter: Secondary | ICD-10-CM | POA: Insufficient documentation

## 2023-07-06 NOTE — ED Notes (Signed)
 Pt has two fingers on right hand buddy taped from where he hurt his hand by punching something earlier this week.

## 2023-07-06 NOTE — ED Triage Notes (Signed)
 Pt to ed from home via POV for pain in the right hand from punching something on Tuesday this week. Pt is caox4, in no acute distress and ambulatory in triage.

## 2023-07-07 NOTE — Discharge Instructions (Addendum)
 Fortunately your x-rays do not reveal any evidence of fracture or dislocation.  You can continue to tape your fingers if that helps.  Use over-the-counter ibuprofen and/or Tylenol as needed according to label instructions.  You can also use ice packs if that helps.

## 2023-07-07 NOTE — ED Provider Notes (Signed)
 [facility] Provider Note    Event Date/Time   First MD Initiated Contact with Patient 07/06/23 2344     (approximate)   History   Hand Injury (right)   HPI Bryan Montes is a 28 y.o. male who presents for evaluation of pain in his right hand after he punched a door frame about 4 days ago.  He has a little bit of swelling particularly to the knuckle of the little finger and some pain throughout his hand.  No significant swelling.  He has taped his 4th and 5th fingers together which helps a little bit and he has continued to work, but he started to worry about it and wanted to make sure he did not have any fracture or dislocation that would prevent healing.  He is right-hand dominant.  No weakness in the hand.  No other injuries.     Physical Exam   Triage Vital Signs: ED Triage Vitals  Encounter Vitals Group     BP 07/06/23 2243 (!) 120/91     Systolic BP Percentile --      Diastolic BP Percentile --      Pulse Rate 07/06/23 2243 83     Resp 07/06/23 2243 16     Temp 07/06/23 2243 98.1 F (36.7 C)     Temp Source 07/06/23 2243 Oral     SpO2 07/06/23 2243 100 %     Weight 07/06/23 2241 55 kg (121 lb 4.1 oz)     Height 07/06/23 2241 1.803 m (5\' 11" )     Head Circumference --      Peak Flow --      Pain Score 07/06/23 2241 7     Pain Loc --      Pain Education --      Exclude from Growth Chart --     Most recent vital signs: Vitals:   07/06/23 2243  BP: (!) 120/91  Pulse: 83  Resp: 16  Temp: 98.1 F (36.7 C)  SpO2: 100%    General: Awake, no distress.  CV:  Good peripheral perfusion.  Resp:  Normal effort. Speaking easily and comfortably, no accessory muscle usage nor intercostal retractions.   Abd:  No distention.  Other:  Mild contusion to the right fifth MCP, otherwise no visible deformities or abnormalities to his fingers or hand.  No tenderness to palpation throughout the hand or fingers.  Normal range of motion,  able to fully flex and extend his fingers and his wrist.  Good grip strength.   ED Results / Procedures / Treatments   Labs (all labs ordered are listed, but only abnormal results are displayed) Labs Reviewed - No data to display   RADIOLOGY I viewed and interpreted the patient's right hand x-rays.  No evidence of fracture or dislocation.  I also read the radiologist's report, which confirmed no acute findings.   PROCEDURES:  Critical Care performed: No  Procedures    IMPRESSION / MDM / ASSESSMENT AND PLAN / ED COURSE  I reviewed the triage vital signs and the nursing notes.                              Differential diagnosis includes, but is not limited to, contusion, fracture, dislocation.  Patient's presentation is most consistent with acute complicated illness / injury requiring diagnostic workup.  Labs/studies ordered: Right hand x-rays  Interventions/Medications given:  Medications - No data  to display  (Note:  hospital course my include additional interventions and/or labs/studies not listed above.)   Reassuring radiographs and physical exam.  I recommended continued management for contusion, give my usual and customary management recommendations and follow-up and return precautions.  No indication for additional evaluation or treatment at this time.       FINAL CLINICAL IMPRESSION(S) / ED DIAGNOSES   Final diagnoses:  Contusion of right hand, initial encounter     Rx / DC Orders   ED Discharge Orders     None        Note:  This document was prepared using Dragon voice recognition software and may include unintentional dictation errors.   Loleta Rose, MD 07/07/23 [phone]

## 2023-07-31 DIAGNOSIS — S92354A Nondisplaced fracture of fifth metatarsal bone, right foot, initial encounter for closed fracture: Secondary | ICD-10-CM | POA: Insufficient documentation

## 2023-07-31 DIAGNOSIS — M7989 Other specified soft tissue disorders: Secondary | ICD-10-CM | POA: Insufficient documentation

## 2023-07-31 DIAGNOSIS — W228XXA Striking against or struck by other objects, initial encounter: Secondary | ICD-10-CM | POA: Insufficient documentation

## 2023-07-31 NOTE — ED Triage Notes (Signed)
 Pt to ED via POV c/o right foor pain after kicking metal door several times about an hr ago Pt has some swelling to foot.

## 2023-08-01 ENCOUNTER — Emergency Department: Payer: Self-pay

## 2023-08-01 ENCOUNTER — Other Ambulatory Visit: Payer: Self-pay

## 2023-08-01 ENCOUNTER — Emergency Department
Admission: EM | Admit: 2023-08-01 | Discharge: 2023-08-01 | Disposition: A | Discharge status: Home / Self Care | Payer: Self-pay | Attending: Emergency Medicine | Admitting: Emergency Medicine

## 2023-08-01 DIAGNOSIS — S92354A Nondisplaced fracture of fifth metatarsal bone, right foot, initial encounter for closed fracture: Secondary | ICD-10-CM

## 2023-08-01 MED ORDER — IBUPROFEN 600 MG PO TABS
600.0000 mg | ORAL_TABLET | Freq: Once | ORAL | Status: AC
  Administered 2023-08-01: 600 mg via ORAL
  Filled 2023-08-01: qty 1

## 2023-08-01 MED ORDER — TRAMADOL HCL 50 MG PO TABS: 50.0000 mg | ORAL_TABLET | Freq: Four times a day (QID) | ORAL | 0 refills | Status: AC | PRN

## 2023-08-01 NOTE — Discharge Instructions (Signed)
 Follow-up with podiatry within the next week.  Maintain the cam boot until you follow-up with podiatry.  Take the pain medication as needed.  Keep the foot elevated when at home.  You can apply ice packs to help decrease the swelling.

## 2023-08-01 NOTE — ED Provider Notes (Signed)
 [facility] Provider Note    Event Date/Time   First MD Initiated Contact with Patient 08/01/23 [phone]     (approximate)   History   Foot Injury   HPI  Bryan Montes is a 28 y.o. male with a history of adjustment disorder who presents with right foot injury that occurred this evening.  The patient states that he became upset and kicked a door several times.  Subsequently started have increased pain and swelling in the right foot.  The patient states that he has been depressed recently.  A few weeks ago he was seen in the ED after punching a wall.  However, the patient states that both times he did not intend to hurt himself and he got very upset.  He states that he is not currently on any medication for depression and is not seeing a psychiatrist or therapist.  However, he states that he has no SI or HI and has no intention of harming himself.  I reviewed the past medical records.  The patient has had a few ED visits this year, and previously was seen in 2021 by psychiatry for evaluation of adjustment disorder.   Physical Exam   Triage Vital Signs: ED Triage Vitals  Encounter Vitals Group     BP 07/31/23 2359 115/87     Systolic BP Percentile --      Diastolic BP Percentile --      Pulse Rate 07/31/23 2359 83     Resp 07/31/23 2359 16     Temp 07/31/23 2359 98.4 F (36.9 C)     Temp Source 07/31/23 2359 Other     SpO2 07/31/23 2359 100 %     Weight 08/01/23 0247 132 lb 4.4 oz (60 kg)     Height 08/01/23 0247 5\' 11"  (1.803 m)     Head Circumference --      Peak Flow --      Pain Score 07/31/23 2359 10     Pain Loc --      Pain Education --      Exclude from Growth Chart --     Most recent vital signs: Vitals:   07/31/23 2359  BP: 115/87  Pulse: 83  Resp: 16  Temp: 98.4 F (36.9 C)  SpO2: 100%     General: Awake, no distress.  CV:  Good peripheral perfusion.  Resp:  Normal effort.  Abd:  No distention.   Other:  Tenderness and swelling to the right midfoot.  Full range of motion at the ankle and toes.  2+ DP pulse.   ED Results / Procedures / Treatments   Labs (all labs ordered are listed, but only abnormal results are displayed) Labs Reviewed - No data to display   EKG     RADIOLOGY  XR R foot: I independently viewed and interpreted the images; there is a fracture at the base of the fifth metatarsal consistent with a pseudo Jones morphology  PROCEDURES:  Critical Care performed: No  Procedures   MEDICATIONS ORDERED IN ED: Medications  ibuprofen  (ADVIL ) tablet 600 mg (600 mg Oral Given 08/01/23 0326)     IMPRESSION / MDM / ASSESSMENT AND PLAN / ED COURSE  I reviewed the triage vital signs and the nursing notes.   Differential diagnosis includes, but is not limited to, right foot injury; x-ray reveals a pseudo Jones type fracture.  The patient has a history of adjustment disorder and reports of feelings of depression but  denies SI or HI and is not a danger to himself or others.  The is no indication for emergent psychiatric evaluation.  He is stable for discharge home at this time.  We will put him in a cam boot, prescribed pain medication, and give him podiatry referral.  I counseled the patient on the results of the imaging, plan of care, and follow-up.  I also counseled him on return precautions both for the foot as well as for his mental health symptoms and he expressed understanding.  Patient's presentation is most consistent with acute complicated illness / injury requiring diagnostic workup.    FINAL CLINICAL IMPRESSION(S) / ED DIAGNOSES   Final diagnoses:  Nondisplaced fracture of fifth metatarsal bone, right foot, initial encounter for closed fracture     Rx / DC Orders   ED Discharge Orders          Ordered    traMADol  (ULTRAM ) 50 MG tablet  Every 6 hours PRN        08/01/23 0320             Note:  This document was prepared using Dragon voice  recognition software and may include unintentional dictation errors.    Lind Repine, MD 08/01/23 [phone]

## 2023-09-13 ENCOUNTER — Other Ambulatory Visit: Payer: Self-pay

## 2023-09-13 DIAGNOSIS — N4889 Other specified disorders of penis: Secondary | ICD-10-CM

## 2023-09-13 DIAGNOSIS — N50819 Testicular pain, unspecified: Secondary | ICD-10-CM

## 2023-09-17 ENCOUNTER — Ambulatory Visit (INDEPENDENT_AMBULATORY_CARE_PROVIDER_SITE_OTHER): Payer: Self-pay | Admitting: Urology

## 2023-09-17 ENCOUNTER — Other Ambulatory Visit
Admission: RE | Admit: 2023-09-17 | Discharge: 2023-09-17 | Disposition: A | Discharge status: Home / Self Care | Payer: Self-pay | Attending: Urology | Admitting: Urology

## 2023-09-17 ENCOUNTER — Ambulatory Visit: Payer: Self-pay | Admitting: Urology

## 2023-09-17 ENCOUNTER — Encounter: Payer: Self-pay | Admitting: Urology

## 2023-09-17 VITALS — BP 105/64 | HR 72 | Ht 71.0 in | Wt 132.0 lb

## 2023-09-17 DIAGNOSIS — N4889 Other specified disorders of penis: Secondary | ICD-10-CM

## 2023-09-17 DIAGNOSIS — Z113 Encounter for screening for infections with a predominantly sexual mode of transmission: Secondary | ICD-10-CM | POA: Insufficient documentation

## 2023-09-17 DIAGNOSIS — N50819 Testicular pain, unspecified: Secondary | ICD-10-CM | POA: Insufficient documentation

## 2023-09-17 LAB — URINALYSIS, COMPLETE (UACMP) WITH MICROSCOPIC
Bilirubin Urine: NEGATIVE
Glucose, UA: NEGATIVE mg/dL
Hgb urine dipstick: NEGATIVE
Ketones, ur: NEGATIVE mg/dL
Leukocytes,Ua: NEGATIVE
Nitrite: NEGATIVE
Protein, ur: NEGATIVE mg/dL
RBC / HPF: NONE SEEN RBC/hpf (ref 0–5)
Specific Gravity, Urine: 1.02 (ref 1.005–1.030)
pH: 5.5 (ref 5.0–8.0)

## 2023-09-17 LAB — CHLAMYDIA/NGC RT PCR (ARMC ONLY)
Chlamydia Tr: NOT DETECTED
N gonorrhoeae: NOT DETECTED

## 2023-09-17 NOTE — Patient Instructions (Signed)
 Bryan Montes

## 2023-09-17 NOTE — Progress Notes (Signed)
   09/17/23 9:41 AM   Bryan Montes November 06, 1995 [id number]  CC: Penile injury, pelvic pain  HPI: 28 year old male who was sexually active with his girlfriend on 09/01/2023 and reportedly heard a pop during sexual activity when she was on top.  He did ultimately lose his erection.  He never had any bruising or swelling.  He has continued to get erections since that time.  His pain had improved over about a week or 2, but he recently fell asleep with ice on his penis and feels like his symptoms have been worse since then.  Denies any gross hematuria or dysuria, some occasional urinary urgency.  Occasional left testicular pain.  Pelvic discomfort.  He has had some anxiety surrounding this recent issue and wanted to get checked out.  He also is requesting STD testing.  He continues to be able to get erections, denies any penile curvature.  He has been taking ibuprofen  with some improvement in his penile pain.   Surgical History: Past Surgical History:  Procedure Laterality Date   FRACTURE SURGERY       Family History: Family History  Problem Relation Age of Onset   Breast cancer Maternal Grandmother     Social History:  reports that he has quit smoking. His smoking use included cigars and cigarettes. He has a 1.8 pack-year smoking history. He has never used smokeless tobacco. He reports that he does not currently use alcohol.  Drug: Marijuana.  Physical Exam: BP 105/64   Pulse 72   Ht 5' 11 (1.803 m)   Wt 132 lb (59.9 kg)   BMI 18.41 kg/m    Constitutional:  Alert and oriented, No acute distress. Cardiovascular: No clubbing, cyanosis, or edema. Respiratory: Normal respiratory effort, no increased work of breathing. GI: Abdomen is soft, nontender, nondistended, no abdominal masses GU: Circumcised phallus with no lesions, patent meatus, no bruising or swelling, no tenderness, no palpable plaques   Assessment & Plan:   28 year old male with recent penile injury  during sexual activity, no clinical or physical exam findings concerning for true penile fracture.  Reassurance was provided.  Recommended pelvic floor stretching exercises, can continue NSAIDs as needed.  STD testing sent and we will call with results.  Pelvic floor exercises provided STD testing sent, call with results Follow-up with urology as needed, return precautions discussed   Jay Meth, MD 09/17/2023  [facility] [address] [place], [place] [address] [phone]

## 2023-09-18 NOTE — Telephone Encounter (Signed)
Called pt informed him of the information below. Pt voiced understanding.  

## 2023-09-18 NOTE — Telephone Encounter (Signed)
-----   Message from Lawerence Pressman sent at 09/17/2023  3:58 PM EDT ----- STD testing negative, follow-up as needed ----- Message ----- From: Interface, Lab In Many Sent: 09/17/2023   8:37 AM EDT To: Lawerence Pressman, MD

## 2023-10-08 ENCOUNTER — Ambulatory Visit: Admitting: Urology

## 2024-01-24 ENCOUNTER — Ambulatory Visit

## 2024-01-24 DIAGNOSIS — R21 Rash and other nonspecific skin eruption: Secondary | ICD-10-CM

## 2024-01-24 DIAGNOSIS — Z113 Encounter for screening for infections with a predominantly sexual mode of transmission: Secondary | ICD-10-CM

## 2024-01-24 DIAGNOSIS — R221 Localized swelling, mass and lump, neck: Secondary | ICD-10-CM

## 2024-01-24 LAB — HM HIV SCREENING LAB: HM HIV Screening: NEGATIVE

## 2024-01-24 LAB — HM HEPATITIS C SCREENING LAB: HM Hepatitis Screen: NEGATIVE

## 2024-01-24 MED ORDER — CLOTRIMAZOLE-BETAMETHASONE 1-0.05 % EX CREA: 1.0000 | TOPICAL_CREAM | Freq: Every day | CUTANEOUS | Status: DC

## 2024-01-24 NOTE — Progress Notes (Signed)
 [facility] Department STI clinic [address] [place] [address] Main phone: [phone]  STI screening visit  Subjective:  Bryan Montes is a 28 y.o. male being seen today for an STI screening visit. The patient reports they do not have symptoms.    Patient has the following medical conditions:  Patient Active Problem List   Diagnosis Date Noted   Adjustment disorder with mixed disturbance of emotions and conduct    Cellulitis 09/24/2018   Abscess, neck 09/24/2018   Neck abscess 04/02/2017   Chief Complaint  Patient presents with   SEXUALLY TRANSMITTED DISEASE    Pt is here for STD screening and has no symptoms   HPI Patient reports desire for asymptomatic STI testing. 2 partners in the last 2 months, condoms sometimes. No known contacts. Does have a rash on his inner thighs that appeared after getting a massage.  See flowsheet for further details and programmatic requirements  Hyperlink available at the top of the signed note in blue.  Flow sheet content below:  Pregnancy Intention Screening Does the patient want to become pregnant in the next year?: N/A Does the patient's partner want to become pregnant in the next year?: No Would the patient like to discuss contraceptive options today?: N/A Reason For STD Screen STD Screening: Is asymptomatic Have you ever had an STD?: No STD Symptoms Denies all: Yes Risk Factors for Hep B Household, sexual, or needle sharing contact of a person infected with Hep B: No Sexual contact with a person who uses drugs not as prescribed?: No Currently or Ever used drugs not as prescribed: Yes HIV Positive: No PRep Patient: No Men who have sex with men: No Have Hepatitis C: No History of Incarceration: Yes History of Homeslessness?: No Anal sex following anal drug use?: No Risk Factors for Hep C Currently using drugs not as prescribed: No Sexual partner(s) currently using drugs  as not prescribed: No History of drug use: Yes HIV Positive: No People with a history of incarceration: Yes People born between the years of 59 and 43: No Hepatitis Counseling Hep B Counseling: Counseled patient about increased risk of Hep B and recommendation for testing, Patient accepts testing for Hep B today Hep C Counseling: Counseled patient about increased risk of Hep C and recommendation for testing, Patient accepts testing for Hep C today  Screening for MPX risk:  Unexplained rash?  No   MSM?  No   Multiple or anonymous sex partners?  No   Any close or sexual contact with a person  diagnosed with MPX?  No   Any outside the [place]  where MPX is endemic?  No   High clinical suspicion for MPX?    -Unlikely to be chickenpox    -Lymphadenopathy    -Rash that presents in same phase of       evolution on any given body part  No   STI screening history: Last HIV test per patient/review of record was No results found for: HMHIVSCREEN  Lab Results  Component Value Date   HIV Non Reactive 09/25/2018    Last HEPC test per patient/review of record was No results found for: HMHEPCSCREEN No components found for: HEPC   Last HEPB test per patient/review of record was No components found for: HMHEPBSCREEN   Fertility: Does the patient or their partner desires a pregnancy in the next year? No  Immunization History  Administered Date(s) Administered   Tdap 06/10/2023    The  following portions of the patient's history were reviewed and updated as appropriate: allergies, current medications, past medical history, past social history, past surgical history and problem list.  Objective:  There were no vitals filed for this visit.  Physical Exam Vitals and nursing note reviewed.  Constitutional:      Appearance: Normal appearance.  HENT:     Head: Normocephalic and atraumatic.     Mouth/Throat:     Mouth: Mucous membranes are moist.     Pharynx: No oropharyngeal exudate  or posterior oropharyngeal erythema.  Eyes:     General:        Right eye: No discharge.        Left eye: No discharge.     Conjunctiva/sclera:     Right eye: Right conjunctiva is not injected. No exudate.    Left eye: Left conjunctiva is not injected. No exudate. Neck:      Comments: Red circle represents a 3 cm fluctuant, non-tender mass Pulmonary:     Effort: Pulmonary effort is normal.  Abdominal:     General: Abdomen is flat.     Palpations: Abdomen is soft. There is no hepatomegaly or mass.     Tenderness: There is no abdominal tenderness. There is no rebound.  Genitourinary:    Comments: Declined genital exam- asymptomatic Lymphadenopathy:     Cervical: No cervical adenopathy.     Upper Body:     Right upper body: No supraclavicular or axillary adenopathy.     Left upper body: No supraclavicular or axillary adenopathy.  Skin:    General: Skin is warm and dry.     Findings: Rash present.     Comments: Red patches on inner thighs with scattered macules  Neurological:     Mental Status: He is alert and oriented to person, place, and time.    Assessment and Plan:  Bryan Montes is a 28 y.o. male presenting to the [facility] Department for STI screening  1. Screening for venereal disease (Primary)  - Chlamydia/GC NAA, Confirmation - HIV/HCV [facility] - Syphilis Serology, [facility] - Gonococcus culture - HBV Antigen/Antibody [facility]  2. Skin rash  - Suspect fungal in nature given appeared after a massage, itchy, a few larger 2-3 cm patches and scattered macules.  - clotrimazole-betamethasone (LOTRISONE) cream; Apply 1 Application topically daily.   3. Neck mass  - Non-tender, fluctuant 3 cm mass on left side of his neck just below his jawline. Patient reports has been there for a while and he has not sought care for it. - Referred to [facility] for evaluation  Patient does not have STI symptoms Patient accepted the  following screenings: oral GC culture, urine CT/GC, HIV, RPR, Hep B, and Hep C Patient meets criteria for HepB screening? Yes. Ordered? yes Patient meets criteria for HepC screening? Yes. Ordered? yes Recommended condom use with all sex Discussed importance of condom use for STI prevention  Treat positive test results per standing order. Discussed time line for [facility] results and that patient will be called with positive results and encouraged patient to call if he had not heard in 2 weeks Recommended repeat testing in 3 months with positive results. Recommended returning for continued or worsening symptoms.   Return in about 3 months (around 04/25/2024). Discussed that HIV results will not necessarily reflect exposures of last 3 weeks, discussed repeating the HIV test if he is concerned about more recent exposures.  No future  appointments.  Damien FORBES Satchel, NP

## 2024-01-24 NOTE — Progress Notes (Signed)
 Pt is here for STD screening. Patient was dispensed Clotrimazole 1% topical cream.  Counseling was provided today regarding the cream and when to follow up. Pt was given the opportunity to ask questions for any clarification, questions answered.Condoms given, Kwadwo Zacarias Krauter,RN.

## 2024-01-25 LAB — HBV ANTIGEN/ANTIBODY STATE LAB
Hep B Core Total Ab: NONREACTIVE
Hep B S Ab: NONREACTIVE
Hepatitis B Surface Ag: NONREACTIVE

## 2024-01-27 LAB — CHLAMYDIA/GC NAA, CONFIRMATION
Chlamydia trachomatis, NAA: NEGATIVE
Neisseria gonorrhoeae, NAA: NEGATIVE

## 2024-01-28 LAB — GONOCOCCUS CULTURE

## 2024-01-31 ENCOUNTER — Emergency Department
Admission: EM | Admit: 2024-01-31 | Discharge: 2024-01-31 | Disposition: A | Discharge status: Home / Self Care | Attending: Emergency Medicine | Admitting: Emergency Medicine

## 2024-01-31 ENCOUNTER — Encounter: Payer: Self-pay | Admitting: Emergency Medicine

## 2024-01-31 ENCOUNTER — Other Ambulatory Visit: Payer: Self-pay

## 2024-01-31 ENCOUNTER — Emergency Department

## 2024-01-31 DIAGNOSIS — J069 Acute upper respiratory infection, unspecified: Secondary | ICD-10-CM | POA: Insufficient documentation

## 2024-01-31 DIAGNOSIS — R0602 Shortness of breath: Secondary | ICD-10-CM | POA: Diagnosis present

## 2024-01-31 DIAGNOSIS — F17210 Nicotine dependence, cigarettes, uncomplicated: Secondary | ICD-10-CM | POA: Insufficient documentation

## 2024-01-31 DIAGNOSIS — J4520 Mild intermittent asthma, uncomplicated: Secondary | ICD-10-CM | POA: Insufficient documentation

## 2024-01-31 LAB — RESP PANEL BY RT-PCR (RSV, FLU A&B, COVID)  RVPGX2
Influenza A by PCR: NEGATIVE
Influenza B by PCR: NEGATIVE
Resp Syncytial Virus by PCR: NEGATIVE
SARS Coronavirus 2 by RT PCR: NEGATIVE

## 2024-01-31 LAB — GROUP A STREP BY PCR: Group A Strep by PCR: NOT DETECTED

## 2024-01-31 MED ORDER — IPRATROPIUM-ALBUTEROL 0.5-2.5 (3) MG/3ML IN SOLN
3.0000 mL | Freq: Once | RESPIRATORY_TRACT | Status: AC
  Administered 2024-01-31: 3 mL via RESPIRATORY_TRACT
  Filled 2024-01-31: qty 3

## 2024-01-31 MED ORDER — ALBUTEROL SULFATE HFA 108 (90 BASE) MCG/ACT IN AERS: 2.0000 | INHALATION_SPRAY | Freq: Four times a day (QID) | RESPIRATORY_TRACT | 1 refills | Status: DC | PRN

## 2024-01-31 MED ORDER — PSEUDOEPH-BROMPHEN-DM 30-2-10 MG/5ML PO SYRP: 5.0000 mL | ORAL_SOLUTION | Freq: Four times a day (QID) | ORAL | 0 refills | Status: DC | PRN

## 2024-01-31 NOTE — ED Notes (Signed)
 See triage note  Presents with cough,sore throat and body aches  This started on Saturday   Afebrile on arrival States cough has been productive

## 2024-01-31 NOTE — Discharge Instructions (Addendum)
 Follow-up of [place] urgent care if any continued problems.  2 prescriptions were sent to the pharmacy 1 is for cough and congestion the other areas a albuterol inhaler to be used as needed for cough and wheezing.  You definitely need to consider not smoking as this will cause increased problems in the future with your breathing and that each time you get a cold will cause wheezing.

## 2024-01-31 NOTE — ED Triage Notes (Signed)
 Pt ambulatory to triage, gait steady, no acute distress noted c/o sore throat, productive cough w/ yellow sputum, and sob since Saturday. Sob worse when lying flat.

## 2024-01-31 NOTE — ED Provider Notes (Signed)
 [facility] Provider Note    Event Date/Time   First MD Initiated Contact with Patient 01/31/24 [phone]     (approximate)   History   Sore Throat, Cough, and Shortness of Breath   HPI  Bryan Montes is a 28 y.o. male presents to the ED with complaint of cough, congestion, body aches, sore throat and shortness of breath for 6 days.  Patient states shortness of breath is worse when lying flat.  He states for the last several days he has woke up coughing.  Patient reports initially he had a fever.  Patient is a smoker and admits to daily use of marijuana.  No previous history of bronchitis or pneumonia.     Physical Exam   Triage Vital Signs: ED Triage Vitals  Encounter Vitals Group     BP 01/31/24 0536 120/84     Girls Systolic BP Percentile --      Girls Diastolic BP Percentile --      Boys Systolic BP Percentile --      Boys Diastolic BP Percentile --      Pulse Rate 01/31/24 0536 86     Resp 01/31/24 0536 18     Temp 01/31/24 0536 97.6 F (36.4 C)     Temp Source 01/31/24 0536 Oral     SpO2 01/31/24 0536 100 %     Weight 01/31/24 0712 132 lb 4.4 oz (60 kg)     Height 01/31/24 0711 5' 11 (1.803 m)     Head Circumference --      Peak Flow --      Pain Score 01/31/24 0537 6     Pain Loc --      Pain Education --      Exclude from Growth Chart --     Most recent vital signs: Vitals:   01/31/24 0536  BP: 120/84  Pulse: 86  Resp: 18  Temp: 97.6 F (36.4 C)  SpO2: 100%     General: Awake, no distress.  Talkative, cooperative, able to answer questions in complete sentences without any difficulty. CV:  Good peripheral perfusion.  Heart regular rate and rhythm. Resp:  Normal effort.  Lungs with mild expiratory wheeze.  Patient occasional nonproductive cough. Abd:  No distention.  Other:     ED Results / Procedures / Treatments   Labs (all labs ordered are listed, but only abnormal results are displayed) Labs Reviewed   RESP PANEL BY RT-PCR (RSV, FLU A&B, COVID)  [id number]  GROUP A STREP BY PCR     RADIOLOGY  Chest x-ray images were reviewed and interpreted by myself independent of the radiologist and no infiltrate was noted.  Official radiology report notes hyperinflation suggestive of asthma.   PROCEDURES:  Critical Care performed:   Procedures   MEDICATIONS ORDERED IN ED: Medications  ipratropium-albuterol (DUONEB) 0.5-2.5 (3) MG/3ML nebulizer solution 3 mL (3 mLs Nebulization Given 01/31/24 0811)  ipratropium-albuterol (DUONEB) 0.5-2.5 (3) MG/3ML nebulizer solution 3 mL (3 mLs Nebulization Given 01/31/24 0840)     IMPRESSION / MDM / ASSESSMENT AND PLAN / ED COURSE  I reviewed the triage vital signs and the nursing notes.   Differential diagnosis includes, but is not limited to, COVID, influenza, RSV, pneumonia, bronchitis, viral upper respiratory infection.  28 year old male presents to the ED with complaint of cough and congestion along with shortness of breath noticed more with lying down and coughing first thing in the morning.  Patient continues to smoke cigarettes  each day along with marijuana x 1.  Patient respiratory panel was negative and chest x-ray suggest asthma.  He was given DuoNeb treatments while in the ED and states that this has improved his breathing.  Minimal to no expiratory wheezes are noted.  Patient was strongly encouraged to discontinue smoking.  A prescription for albuterol was sent to the pharmacy for him begin using.  Patient is to follow-up with urgent care at [place] if any continued problems or return to the emergency department if any worsening of his symptoms.      Patient's presentation is most consistent with acute complicated illness / injury requiring diagnostic workup.  FINAL CLINICAL IMPRESSION(S) / ED DIAGNOSES   Final diagnoses:  Viral URI with cough  Mild intermittent asthma without complication     Rx / DC Orders   ED Discharge Orders           Ordered    brompheniramine-pseudoephedrine-DM 30-2-10 MG/5ML syrup  4 times daily PRN        01/31/24 0838    albuterol (VENTOLIN HFA) 108 (90 Base) MCG/ACT inhaler  Every 6 hours PRN        01/31/24 0838             Note:  This document was prepared using Dragon voice recognition software and may include unintentional dictation errors.   Saunders Shona CROME, PA-C 01/31/24 1300    Dorothyann Drivers, MD 01/31/24 [phone]

## 2024-02-17 ENCOUNTER — Encounter: Payer: Self-pay | Admitting: Emergency Medicine

## 2024-02-17 ENCOUNTER — Ambulatory Visit
Admission: EM | Admit: 2024-02-17 | Discharge: 2024-02-17 | Disposition: A | Discharge status: Home / Self Care | Payer: Self-pay | Attending: Emergency Medicine | Admitting: Emergency Medicine

## 2024-02-17 DIAGNOSIS — J209 Acute bronchitis, unspecified: Secondary | ICD-10-CM

## 2024-02-17 MED ORDER — PROMETHAZINE-DM 6.25-15 MG/5ML PO SYRP: 5.0000 mL | ORAL_SOLUTION | Freq: Every evening | ORAL | 0 refills | Status: DC | PRN

## 2024-02-17 MED ORDER — BENZONATATE 100 MG PO CAPS: 100.0000 mg | ORAL_CAPSULE | Freq: Three times a day (TID) | ORAL | 0 refills | Status: DC

## 2024-02-17 MED ORDER — ALBUTEROL SULFATE HFA 108 (90 BASE) MCG/ACT IN AERS: 2.0000 | INHALATION_SPRAY | Freq: Four times a day (QID) | RESPIRATORY_TRACT | 1 refills | Status: AC | PRN

## 2024-02-17 MED ORDER — PREDNISONE 10 MG (21) PO TBPK: ORAL_TABLET | Freq: Every day | ORAL | 0 refills | Status: DC

## 2024-02-17 NOTE — Discharge Instructions (Signed)
 On exam able to hear wheezing which is a sign of airway tightness and as her symptoms have persisted for 5 weeks I do believe that you have bronchitis which is an inflammatory condition of the upper airway  Continue taking the Augmentin  that you have as this will treat any bacteria that is causing symptoms to linger  Begin prednisone every morning with food to open and relax the airway making it easier for you to breathe, avoid ibuprofen  while taking, avoid smoking as this is counterproductive as smoke is a airway irritant  You may use inhaler as needed for shortness of breath and wheezing  May use Tessalon pill every 8 hours as needed for cough and may use cough syrup at bedtime to allow you to rest  You can take Tylenol   as needed for fever reduction and pain relief.   For cough: honey 1/2 to 1 teaspoon (you can dilute the honey in water or another fluid).  You can also use guaifenesin and dextromethorphan for cough. You can use a humidifier for chest congestion and cough.  If you don't have a humidifier, you can sit in the bathroom with the hot shower running.      For sore throat: try warm salt water gargles, cepacol lozenges, throat spray, warm tea or water with lemon/honey, popsicles or ice, or OTC cold relief medicine for throat discomfort.   For congestion: take a daily anti-histamine like Zyrtec, Claritin, and a oral decongestant, such as pseudoephedrine.  You can also use Flonase 1-2 sprays in each nostril daily.   It is important to stay hydrated: drink plenty of fluids (water, gatorade/powerade/pedialyte, juices, or teas) to keep your throat moisturized and help further relieve irritation/discomfort.

## 2024-02-17 NOTE — ED Provider Notes (Signed)
 Bryan Montes    CSN: [id number] Arrival date & time: 02/17/24  1147      History   Chief Complaint Chief Complaint  Patient presents with   Cough    HPI Bryan Montes is a 28 y.o. male.   Patient presents for evaluation of a persisting primarily nonproductive cough, shortness of breath and wheezing present for 5 weeks.  When productive sputum ranges from clear to green in presentation.  Experiencing shortness of breath while at rest primarily occurring when laying flat and overnight also seem to have episodes of hyperventilation and lightheadedness, taking about 20 minutes to settle the body.  Endorses wheezing occurs with inhalation and exhalation.  Has experienced a persistent sore throat and tingling to the throat.  Was evaluated in the emergency department on 10/31/2023 for same symptoms diagnosed with viral respiratory illness, chest x-ray completed at that time.  Has attempted use of Robitussin, Sudafed, antihistamines and Bromfed cough syrup.  Over the last 3 to 4 days began taking Augmentin  which she got from [place].  Daily tobacco and marijuana use.  Denies respiratory history.    Past Medical History:  Diagnosis Date   Acute prostatitis     Patient Active Problem List   Diagnosis Date Noted   Adjustment disorder with mixed disturbance of emotions and conduct    Cellulitis 09/24/2018   Abscess, neck 09/24/2018   Neck abscess 04/02/2017    Past Surgical History:  Procedure Laterality Date   FRACTURE SURGERY         Home Medications    Prior to Admission medications   Medication Sig Start Date End Date Taking? Authorizing Provider  albuterol (VENTOLIN HFA) 108 (90 Base) MCG/ACT inhaler Inhale 2 puffs into the lungs every 6 (six) hours as needed for wheezing or shortness of breath. 01/31/24   Saunders Shona CROME, PA-C  brompheniramine-pseudoephedrine-DM 30-2-10 MG/5ML syrup Take 5 mLs by mouth 4 (four) times daily as needed. 01/31/24    Saunders Shona CROME, PA-C  clotrimazole-betamethasone (LOTRISONE) cream Apply 1 Application topically daily. 01/24/24   Rosabel Damien BRAVO, NP    Family History Family History  Problem Relation Age of Onset   Breast cancer Maternal Grandmother     Social History Social History   Tobacco Use   Smoking status: Every Day    Current packs/day: 0.25    Average packs/day: 0.3 packs/day for 7.0 years (1.8 ttl pk-yrs)    Types: Cigars, Cigarettes   Smokeless tobacco: Never  Vaping Use   Vaping status: Never Used  Substance Use Topics   Alcohol use: Not Currently   Drug use: Yes    Types: Marijuana    Comment: daily     Allergies   Patient has no known allergies.   Review of Systems Review of Systems  Constitutional: Negative.   HENT:  Positive for congestion and sore throat. Negative for dental problem, drooling, ear discharge, ear pain, facial swelling, hearing loss, mouth sores, nosebleeds, postnasal drip, rhinorrhea, sinus pressure, sinus pain, sneezing, tinnitus, trouble swallowing and voice change.   Respiratory:  Positive for cough, shortness of breath and wheezing. Negative for apnea, choking, chest tightness and stridor.   Gastrointestinal:  Positive for diarrhea and nausea. Negative for abdominal distention, abdominal pain, anal bleeding, blood in stool, constipation, rectal pain and vomiting.  Neurological: Negative.      Physical Exam Triage Vital Signs ED Triage Vitals  Encounter Vitals Group     BP 02/17/24 1158 103/77  Girls Systolic BP Percentile --      Girls Diastolic BP Percentile --      Boys Systolic BP Percentile --      Boys Diastolic BP Percentile --      Pulse Rate 02/17/24 1158 85     Resp 02/17/24 1158 18     Temp 02/17/24 1158 97.6 F (36.4 C)     Temp Source 02/17/24 1158 Oral     SpO2 02/17/24 1157 100 %     Weight --      Height --      Head Circumference --      Peak Flow --      Pain Score 02/17/24 1157 0     Pain Loc --      Pain  Education --      Exclude from Growth Chart --    No data found.  Updated Vital Signs BP 103/77 (BP Location: Left Arm)   Pulse 85   Temp 97.6 F (36.4 C) (Oral)   Resp 18   SpO2 100%   Visual Acuity Right Eye Distance:   Left Eye Distance:   Bilateral Distance:    Right Eye Near:   Left Eye Near:    Bilateral Near:     Physical Exam Constitutional:      Appearance: Normal appearance.  Eyes:     Extraocular Movements: Extraocular movements intact.  Cardiovascular:     Rate and Rhythm: Normal rate and regular rhythm.     Pulses: Normal pulses.     Heart sounds: Normal heart sounds.  Pulmonary:     Effort: Pulmonary effort is normal.     Breath sounds: Wheezing present.  Neurological:     Mental Status: He is alert and oriented to person, place, and time.      UC Treatments / Results  Labs (all labs ordered are listed, but only abnormal results are displayed) Labs Reviewed - No data to display  EKG   Radiology No results found.  Procedures Procedures (including critical care time)  Medications Ordered in UC Medications - No data to display  Initial Impression / Assessment and Plan / UC Course  I have reviewed the triage vital signs and the nursing notes.  Pertinent labs & imaging results that were available during my care of the patient were reviewed by me and considered in my medical decision making (see chart for details).  Acute bronchitis  Vital signs stable, O2 saturation 100% on room air, patient in no signs of distress nontoxic-appearing, stable for outpatient management, wheezing heard to auscultation, discussed this finding with patient, currently taking prescription of antibiotic recommended continued use, additionally prescribed prednisone, albuterol inhaler, Tessalon and Promethazine DM, chest x-ray on 01/31/2024 suggesting asthma, patient endorsing no prior respiratory history, symptoms most likely related to tobacco use along with initial  virus advised to monitor recommend over-the-counter medications and nonpharmacological supportive care, may follow-up with his urgent care as needed Final Clinical Impressions(s) / UC Diagnoses   Final diagnoses:  None   Discharge Instructions   None    ED Prescriptions   None    PDMP not reviewed this encounter.   Teresa Shelba SAUNDERS, NP 02/17/24 1214

## 2024-02-17 NOTE — ED Triage Notes (Signed)
 Patient reports dry cough x 5 weeks. Patient has irritation to throat. Patient states these are the same symptoms that he was seen in ER for. Patient states it hurts to take deep breath. Patient states he needs work to recover.

## 2024-03-20 ENCOUNTER — Ambulatory Visit
Admission: EM | Admit: 2024-03-20 | Discharge: 2024-03-20 | Disposition: A | Discharge status: Home / Self Care | Payer: Self-pay | Attending: Emergency Medicine | Admitting: Emergency Medicine

## 2024-03-20 ENCOUNTER — Encounter: Payer: Self-pay | Admitting: Emergency Medicine

## 2024-03-20 DIAGNOSIS — J069 Acute upper respiratory infection, unspecified: Secondary | ICD-10-CM

## 2024-03-20 DIAGNOSIS — R051 Acute cough: Secondary | ICD-10-CM

## 2024-03-20 MED ORDER — LEVOFLOXACIN 500 MG PO TABS: 500.0000 mg | ORAL_TABLET | Freq: Every day | ORAL | 0 refills | Status: DC

## 2024-03-20 MED ORDER — GUAIFENESIN-CODEINE 100-10 MG/5ML PO SOLN: 5.0000 mL | Freq: Four times a day (QID) | ORAL | 0 refills | Status: AC | PRN

## 2024-03-20 MED ORDER — FLUTICASONE PROPIONATE HFA 44 MCG/ACT IN AERO: 2.0000 | INHALATION_SPRAY | Freq: Every day | RESPIRATORY_TRACT | 2 refills | Status: AC

## 2024-03-20 MED ORDER — PREDNISONE 10 MG (21) PO TBPK: ORAL_TABLET | Freq: Every day | ORAL | 0 refills | Status: DC

## 2024-03-20 NOTE — ED Provider Notes (Signed)
 " [name]    CSN: [id number] Arrival date & time: 03/20/24  1856      History   Chief Complaint No chief complaint on file.   HPI Bryan Montes is a 28 y.o. male.   BP 128/76 Temp99.0 Hr 89 R 21 02 94  Sore throat for 1.1.5 weeks, Persisitent cougha nd wheezing, 1.5-2- month   Worse at nightime, interfering with sleep, sob ovenright, prodsuctive ocugh, Treatment was helpful. Never bfully went away.   No additional treatment,   Past Medical History:  Diagnosis Date   Acute prostatitis     Patient Active Problem List   Diagnosis Date Noted   Adjustment disorder with mixed disturbance of emotions and conduct    Cellulitis 09/24/2018   Abscess, neck 09/24/2018   Neck abscess 04/02/2017    Past Surgical History:  Procedure Laterality Date   FRACTURE SURGERY         Home Medications    Prior to Admission medications  Medication Sig Start Date End Date Taking? Authorizing Provider  albuterol  (VENTOLIN  HFA) 108 (90 Base) MCG/ACT inhaler Inhale 2 puffs into the lungs every 6 (six) hours as needed for wheezing or shortness of breath. 02/17/24   Ziair Penson, Shelba SAUNDERS, NP  benzonatate  (TESSALON ) 100 MG capsule Take 1 capsule (100 mg total) by mouth every 8 (eight) hours. 02/17/24   Arvel Oquinn, Shelba SAUNDERS, NP  brompheniramine-pseudoephedrine-DM 30-2-10 MG/5ML syrup Take 5 mLs by mouth 4 (four) times daily as needed. 01/31/24   Summers, Rhonda L, PA-C  clotrimazole -betamethasone  (LOTRISONE ) cream Apply 1 Application topically daily. 01/24/24   Rosabel Damien BRAVO, NP  predniSONE  (STERAPRED UNI-PAK 21 TAB) 10 MG (21) TBPK tablet Take by mouth daily. Take 6 tabs by mouth daily  for 1 days, then 5 tabs for 1 days, then 4 tabs for 1 days, then 3 tabs for 1 days, 2 tabs for 1 days, then 1 tab by mouth daily for 1 days 02/17/24   Teresa Shelba SAUNDERS, NP  promethazine -dextromethorphan (PROMETHAZINE -DM) 6.25-15 MG/5ML syrup Take 5 mLs by mouth at bedtime as  needed. 02/17/24   Teresa Shelba SAUNDERS, NP    Family History Family History  Problem Relation Age of Onset   Breast cancer Maternal Grandmother     Social History Social History[1]   Allergies   Patient has no known allergies.   Review of Systems Review of Systems   Physical Exam Triage Vital Signs ED Triage Vitals  Encounter Vitals Group     BP      Girls Systolic BP Percentile      Girls Diastolic BP Percentile      Boys Systolic BP Percentile      Boys Diastolic BP Percentile      Pulse      Resp      Temp      Temp src      SpO2      Weight      Height      Head Circumference      Peak Flow      Pain Score      Pain Loc      Pain Education      Exclude from Growth Chart    No data found.  Updated Vital Signs There were no vitals taken for this visit.  Visual Acuity Right Eye Distance:   Left Eye Distance:   Bilateral Distance:    Right Eye Near:   Left Eye Near:  Bilateral Near:     Physical Exam   UC Treatments / Results  Labs (all labs ordered are listed, but only abnormal results are displayed) Labs Reviewed - No data to display  EKG   Radiology No results found.  Procedures Procedures (including critical care time)  Medications Ordered in UC Medications - No data to display  Initial Impression / Assessment and Plan / UC Course  I have reviewed the triage vital signs and the nursing notes.  Pertinent labs & imaging results that were available during my care of the patient were reviewed by me and considered in my medical decision making (see chart for details).     *** Final Clinical Impressions(s) / UC Diagnoses   Final diagnoses:  None   Discharge Instructions   None    ED Prescriptions   None    PDMP not reviewed this encounter.    [1]  Social History Tobacco Use   Smoking status: Every Day    Current packs/day: 0.25    Average packs/day: 0.3 packs/day for 7.0 years (1.8 ttl pk-yrs)    Types: Cigars,  Cigarettes   Smokeless tobacco: Never  Vaping Use   Vaping status: Never Used  Substance Use Topics   Alcohol use: Not Currently   Drug use: Yes    Types: Marijuana    Comment: daily   "

## 2024-03-20 NOTE — Discharge Instructions (Signed)
 Plan begin Levaquin every morning for 7 days, this is a stronger antibiotic than you took last time and hopefully will it will get rid of any germ contributing to your symptoms  Begin prednisone  every morning with food to open and relax your airway making it easier for you to breathe  Use Flovent inhaler daily taking 2 puffs to help keep the airway open and relax which should help prevent wheezing and shortness of breath from occurring, may use albuterol  throughout the day  May use cough syrup at bedtime to allow for rest  If your symptoms continue to persist past current treatment plan please follow-up with pulmonology, call to schedule follow-up appointment, information of foot pain     For cough: honey 1/2 to 1 teaspoon (you can dilute the honey in water or another fluid).  You can also use dextromethorphan for cough. You can use a humidifier for chest congestion and cough.  If you don't have a humidifier, you can sit in the bathroom with the hot shower running.      For sore throat: try warm salt water gargles, cepacol lozenges, throat spray, warm tea or water with lemon/honey, popsicles or ice, or OTC cold relief medicine for throat discomfort.  It is important to stay hydrated: drink plenty of fluids (water, gatorade/powerade/pedialyte, juices, or teas) to keep your throat moisturized and help further relieve irritation/discomfort.

## 2024-03-20 NOTE — ED Notes (Signed)
 Patient triage by provider Teresa Shelba SAUNDERS, NP

## 2024-04-30 ENCOUNTER — Ambulatory Visit
Admission: EM | Admit: 2024-04-30 | Discharge: 2024-04-30 | Disposition: A | Discharge status: Home / Self Care | Attending: Emergency Medicine | Admitting: Emergency Medicine

## 2024-04-30 ENCOUNTER — Ambulatory Visit (INDEPENDENT_AMBULATORY_CARE_PROVIDER_SITE_OTHER)

## 2024-04-30 DIAGNOSIS — R0602 Shortness of breath: Secondary | ICD-10-CM

## 2024-04-30 DIAGNOSIS — R062 Wheezing: Secondary | ICD-10-CM | POA: Diagnosis not present

## 2024-04-30 MED ORDER — ACETAMINOPHEN 325 MG PO TABS
650.0000 mg | ORAL_TABLET | Freq: Once | ORAL | Status: AC
  Administered 2024-04-30: 650 mg via ORAL

## 2024-04-30 MED ORDER — ALBUTEROL SULFATE HFA 108 (90 BASE) MCG/ACT IN AERS
2.0000 | INHALATION_SPRAY | Freq: Once | RESPIRATORY_TRACT | Status: AC
  Administered 2024-04-30: 2 via RESPIRATORY_TRACT

## 2024-04-30 MED ORDER — AMOXICILLIN-POT CLAVULANATE 875-125 MG PO TABS: 1.0000 | ORAL_TABLET | Freq: Two times a day (BID) | ORAL | 0 refills | Status: AC

## 2024-04-30 MED ORDER — BENZONATATE 100 MG PO CAPS: 100.0000 mg | ORAL_CAPSULE | Freq: Three times a day (TID) | ORAL | 0 refills | Status: AC | PRN

## 2024-04-30 MED ORDER — PREDNISONE 10 MG (21) PO TBPK: ORAL_TABLET | Freq: Every day | ORAL | 0 refills | Status: AC

## 2024-04-30 MED ORDER — ALBUTEROL SULFATE (2.5 MG/3ML) 0.083% IN NEBU
2.5000 mg | INHALATION_SOLUTION | Freq: Once | RESPIRATORY_TRACT | Status: AC
  Administered 2024-04-30: 2.5 mg via RESPIRATORY_TRACT

## 2024-04-30 NOTE — Discharge Instructions (Addendum)
 Follow-up with your new primary care provider as scheduled on 05/11/2024.  Go to the emergency department if you have worsening symptoms.    You were given an albuterol  nebulizer treatment here.  Use the albuterol  inhaler as directed.    Your chest x-ray shows emphysema.  You will need to see a pulmonologist.  Take the Augmentin  and prednisone  as directed.

## 2024-04-30 NOTE — ED Triage Notes (Signed)
 Patient presents to UC for SOB, cough, wheezing x October. States he was seen on three separate visits for respiratory symptoms that have not completely resolved. He states the inhaler prescribed at the last visit the pharmacy did not fill due to needing a prior auth and the antibioitc he did not take due to the side effects. He states the SOB has worsened. SOB with activity and back pain when taking a deep breath. Current treatment ibuprofen  and mucinex .

## 2024-04-30 NOTE — ED Provider Notes (Signed)
 " Bryan Montes    CSN: [id number] Arrival date & time: 04/30/24  1728      History   Chief Complaint Chief Complaint  Patient presents with   Shortness of Breath   Cough   Fever    HPI Bryan Montes is a 29 y.o. male.  Patient presents with 72-month history of cough, wheezing, shortness of breath.  Patient was prescribed Levaquin  in December but states he did not take this medication due to the blackbox warning.  His albuterol  inhaler is almost out; he last used it at 2 AM.  He denies fever or chest pain.   Patient was seen at [facility] ED on 01/31/2024; diagnosed with viral URI with cough and mild intermittent asthma; chest x-ray did not show any acute process respiratory panel negative, strep negative; treated with DuoNebs in the ED and discharged with albuterol  inhaler and Bromfed-DM.  Patient was seen at this urgent care on 02/17/2024; diagnosed with acute bronchitis; treated with prednisone , Tessalon  Perles, Promethazine  DM.  Patient was seen at this urgent care again on 03/20/2024; diagnosed with acute cough and URI; treated with Levaquin , codeine  cough syrup, Flovent  inhaler.  The history is provided by the patient and medical records.    Past Medical History:  Diagnosis Date   Acute prostatitis     Patient Active Problem List   Diagnosis Date Noted   Adjustment disorder with mixed disturbance of emotions and conduct    Cellulitis 09/24/2018   Abscess, neck 09/24/2018   Neck abscess 04/02/2017   Hematospermia 08/07/2016   Penile lesion 08/07/2016    Past Surgical History:  Procedure Laterality Date   FRACTURE SURGERY         Home Medications    Prior to Admission medications  Medication Sig Start Date End Date Taking? Authorizing Provider  amoxicillin -clavulanate (AUGMENTIN ) 875-125 MG tablet Take 1 tablet by mouth every 12 (twelve) hours. 04/30/24  Yes Corlis Burnard DEL, NP  benzonatate  (TESSALON ) 100 MG capsule Take 1 capsule (100 mg  total) by mouth 3 (three) times daily as needed for cough. 04/30/24  Yes Corlis Burnard DEL, NP  predniSONE  (STERAPRED UNI-PAK 21 TAB) 10 MG (21) TBPK tablet Take by mouth daily. As directed 04/30/24  Yes Corlis Burnard DEL, NP  albuterol  (VENTOLIN  HFA) 108 (90 Base) MCG/ACT inhaler Inhale 2 puffs into the lungs every 6 (six) hours as needed for wheezing or shortness of breath. 02/17/24   Teresa Shelba SAUNDERS, NP  fluticasone  (FLOVENT  HFA) 44 MCG/ACT inhaler Inhale 2 puffs into the lungs daily. 03/20/24   White, Shelba SAUNDERS, NP  guaiFENesin -codeine  100-10 MG/5ML syrup Take 5 mLs by mouth every 6 (six) hours as needed for cough. 03/20/24   Teresa Shelba SAUNDERS, NP    Family History Family History  Problem Relation Age of Onset   Breast cancer Maternal Grandmother     Social History Social History[1]   Allergies   Patient has no known allergies.   Review of Systems Review of Systems  Constitutional:  Negative for chills and fever.  HENT:  Negative for ear pain and sore throat.   Respiratory:  Positive for cough, shortness of breath and wheezing.   Cardiovascular:  Negative for chest pain and palpitations.     Physical Exam Triage Vital Signs ED Triage Vitals  Encounter Vitals Group     BP 04/30/24 1804 119/73     Girls Systolic BP Percentile --      Girls Diastolic BP Percentile --  Boys Systolic BP Percentile --      Boys Diastolic BP Percentile --      Pulse Rate 04/30/24 1804 95     Resp 04/30/24 1804 18     Temp 04/30/24 1804 (!) 100.4 F (38 C)     Temp src --      SpO2 --      Weight --      Height --      Head Circumference --      Peak Flow --      Pain Score 04/30/24 1829 10     Pain Loc --      Pain Education --      Exclude from Growth Chart --    No data found.  Updated Vital Signs BP 119/73   Pulse 95   Temp (!) 100.4 F (38 C)   Resp 18   SpO2 (S) 98% Comment: post neb tx  Visual Acuity Right Eye Distance:   Left Eye Distance:   Bilateral Distance:     Right Eye Near:   Left Eye Near:    Bilateral Near:     Physical Exam Constitutional:      General: He is not in acute distress. HENT:     Right Ear: Tympanic membrane normal.     Left Ear: Tympanic membrane normal.     Nose: Nose normal.     Mouth/Throat:     Mouth: Mucous membranes are moist.     Pharynx: Oropharynx is clear.  Cardiovascular:     Rate and Rhythm: Normal rate and regular rhythm.     Heart sounds: Normal heart sounds.  Pulmonary:     Effort: Pulmonary effort is normal. No respiratory distress.     Breath sounds: Wheezing present.     Comments: Expiratory wheezes. Neurological:     Mental Status: He is alert.      UC Treatments / Results  Labs (all labs ordered are listed, but only abnormal results are displayed) Labs Reviewed - No data to display  EKG   Radiology DG Chest 2 View Result Date: 04/30/2024 CLINICAL DATA:  Shortness of breath and cough. EXAM: CHEST - 2 VIEW COMPARISON:  Chest radiograph dated 01/31/2024. FINDINGS: Background of emphysema. No focal consolidation, pleural effusion or pneumothorax. Hyperexpansion of the lungs. The cardiac silhouette is within normal limits. No acute osseous pathology. IMPRESSION: 1. No active cardiopulmonary disease. 2. Emphysema. Electronically Signed   By: Vanetta Chou M.D.   On: 04/30/2024 19:20    Procedures Procedures (including critical care time)  Medications Ordered in UC Medications  acetaminophen  (TYLENOL ) tablet 650 mg (650 mg Oral Given 04/30/24 1838)  albuterol  (VENTOLIN  HFA) 108 (90 Base) MCG/ACT inhaler 2 puff (2 puffs Inhalation Provided for home use 04/30/24 1856)  albuterol  (PROVENTIL ) (2.5 MG/3ML) 0.083% nebulizer solution 2.5 mg (2.5 mg Nebulization Given 04/30/24 1901)    Initial Impression / Assessment and Plan / UC Course  I have reviewed the triage vital signs and the nursing notes.  Pertinent labs & imaging results that were available during my care of the patient were  reviewed by me and considered in my medical decision making (see chart for details).   Shortness of breath, wheezing, cough.  O2 sat 93% on room air upon arrival.  Albuterol  nebulizer treatment given here.  After nebulizer treatment, O2 sat improved to 98% on room air, patient has improved air movement and decreased wheezes.  CXR shows  No active cardiopulmonary disease. 2. Emphysema.  Discussed x-ray findings with patient and instructed him to follow-up with a pulmonologist.  Discharging patient with albuterol  inhaler.  Prescription for Augmentin  and prednisone  sent to patient's pharmacy.  Appointment scheduled for patient to establish with a PCP on 05/11/2024.  Patient will discuss the x-ray finding with his new PCP and seek referral to pulmonology.  Strict ED precautions given.  Education provided on shortness of breath.  He agrees to plan of care.  Final Clinical Impressions(s) / UC Diagnoses   Final diagnoses:  Shortness of breath  Wheezing     Discharge Instructions      Follow-up with your new primary care provider as scheduled on 05/11/2024.  Go to the emergency department if you have worsening symptoms.    You were given an albuterol  nebulizer treatment here.  Use the albuterol  inhaler as directed.    Your chest x-ray shows emphysema.  You will need to see a pulmonologist.  Take the Augmentin  and prednisone  as directed.         ED Prescriptions     Medication Sig Dispense Auth. Provider   amoxicillin -clavulanate (AUGMENTIN ) 875-125 MG tablet Take 1 tablet by mouth every 12 (twelve) hours. 14 tablet Corlis Sor H, NP   predniSONE  (STERAPRED UNI-PAK 21 TAB) 10 MG (21) TBPK tablet Take by mouth daily. As directed 21 tablet Kennth Vanbenschoten H, NP   benzonatate  (TESSALON ) 100 MG capsule Take 1 capsule (100 mg total) by mouth 3 (three) times daily as needed for cough. 21 capsule Corlis Sor DEL, NP      PDMP not reviewed this encounter.    [1]  Social History Tobacco Use    Smoking status: Every Day    Current packs/day: 0.25    Average packs/day: 0.3 packs/day for 7.0 years (1.8 ttl pk-yrs)    Types: Cigars, Cigarettes   Smokeless tobacco: Never  Vaping Use   Vaping status: Never Used  Substance Use Topics   Alcohol use: Not Currently   Drug use: Yes    Types: Marijuana    Comment: daily     Corlis Sor DEL, NP 04/30/24 1942  "

## 2024-05-11 ENCOUNTER — Ambulatory Visit: Admitting: Family Medicine
